# Patient Record
Sex: Male | Born: 1990 | Race: White | Hispanic: No | Marital: Single | Smoking: Current every day smoker
Health system: Southern US, Community
[De-identification: ages and names within clinical notes are randomized; demographics above are authoritative.]

## PROBLEM LIST (undated history)

## (undated) DIAGNOSIS — F32A Depression, unspecified: Secondary | ICD-10-CM

## (undated) DIAGNOSIS — F329 Major depressive disorder, single episode, unspecified: Secondary | ICD-10-CM

## (undated) DIAGNOSIS — F319 Bipolar disorder, unspecified: Secondary | ICD-10-CM

## (undated) DIAGNOSIS — F419 Anxiety disorder, unspecified: Secondary | ICD-10-CM

## (undated) HISTORY — PX: MANDIBLE FRACTURE SURGERY: SHX706

---

## 2012-01-30 ENCOUNTER — Encounter (HOSPITAL_COMMUNITY): Payer: Self-pay

## 2012-01-30 ENCOUNTER — Emergency Department (HOSPITAL_COMMUNITY)
Admission: EM | Admit: 2012-01-30 | Discharge: 2012-01-30 | Disposition: A | Discharge status: Home / Self Care | Payer: Self-pay | Attending: Emergency Medicine | Admitting: Emergency Medicine

## 2012-01-30 DIAGNOSIS — Y9389 Activity, other specified: Secondary | ICD-10-CM | POA: Insufficient documentation

## 2012-01-30 DIAGNOSIS — Y929 Unspecified place or not applicable: Secondary | ICD-10-CM | POA: Insufficient documentation

## 2012-01-30 DIAGNOSIS — Z8659 Personal history of other mental and behavioral disorders: Secondary | ICD-10-CM | POA: Insufficient documentation

## 2012-01-30 DIAGNOSIS — S61421A Laceration with foreign body of right hand, initial encounter: Secondary | ICD-10-CM

## 2012-01-30 DIAGNOSIS — F17200 Nicotine dependence, unspecified, uncomplicated: Secondary | ICD-10-CM | POA: Insufficient documentation

## 2012-01-30 DIAGNOSIS — W268XXA Contact with other sharp object(s), not elsewhere classified, initial encounter: Secondary | ICD-10-CM | POA: Insufficient documentation

## 2012-01-30 DIAGNOSIS — S61409A Unspecified open wound of unspecified hand, initial encounter: Secondary | ICD-10-CM | POA: Insufficient documentation

## 2012-01-30 DIAGNOSIS — M2540 Effusion, unspecified joint: Secondary | ICD-10-CM | POA: Insufficient documentation

## 2012-01-30 DIAGNOSIS — F319 Bipolar disorder, unspecified: Secondary | ICD-10-CM | POA: Insufficient documentation

## 2012-01-30 HISTORY — DX: Major depressive disorder, single episode, unspecified: F32.9

## 2012-01-30 HISTORY — DX: Anxiety disorder, unspecified: F41.9

## 2012-01-30 HISTORY — DX: Depression, unspecified: F32.A

## 2012-01-30 HISTORY — DX: Bipolar disorder, unspecified: F31.9

## 2012-01-30 MED ORDER — HYDROCODONE-ACETAMINOPHEN 5-325 MG PO TABS
1.0000 | ORAL_TABLET | Freq: Once | ORAL | Status: AC
  Administered 2012-01-30: 1 via ORAL
  Filled 2012-01-30: qty 1

## 2012-01-30 MED ORDER — HYDROCODONE-ACETAMINOPHEN 5-325 MG PO TABS: 1.0000 | ORAL_TABLET | ORAL | Status: DC | PRN

## 2012-01-30 MED ORDER — CEPHALEXIN 500 MG PO CAPS: 500.0000 mg | ORAL_CAPSULE | Freq: Four times a day (QID) | ORAL | Status: DC

## 2012-01-30 NOTE — ED Provider Notes (Signed)
Medical screening examination/treatment/procedure(s) were performed by non-physician practitioner and as supervising physician I was immediately available for consultation/collaboration.  Ethelda Chick, MD 01/30/12 2024

## 2012-01-30 NOTE — ED Notes (Signed)
Pt verbalizes understanding 
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
## 2012-01-30 NOTE — ED Provider Notes (Signed)
Medical screening examination/treatment/procedure(s) were performed by non-physician practitioner and as supervising physician I was immediately available for consultation/collaboration.  Ethelda Chick, MD 01/30/12 2023

## 2012-01-30 NOTE — ED Provider Notes (Signed)
LACERATION REPAIR Performed by: Langley Adie A Authorized by: Langley Adie A Consent: Verbal consent obtained. Risks and benefits: risks, benefits and alternatives were discussed Consent given by: patient Patient identity confirmed: provided demographic data Prepped and Draped in normal sterile fashion Wound explored  Laceration Location: right hand dorsum, overlying 2nd, 3rd MCP and interweb space  Laceration Length: 4 cm (total of 3 lacerations)  Wounds contains multiple foreign bodies consistent with ceramic soap dispenser as per history Anesthesia: local infiltration  Local anesthetic: lidocaine 1% w/o epinephrine  Anesthetic total: 2 ml  Irrigation method: syringe, copius irrigation performed as well as manual FB removal with forceps Amount of cleaning: extensive  Skin closure: Multi-layer - 6-0 Vicryl, 4-0 and 5-0 Prolene  Number of sutures: SQ - 3, 9 with mixed 4-0, 5-0 prolene with good wound edge approximation.  Technique: simple interrupted  Patient tolerance: Patient tolerated the procedure well with no immediate complications. Discussed possibility of retained foreign body and appropriate follow up.  Rodena Medin, PA-C 01/30/12 2011
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
## 2018-10-23 ENCOUNTER — Inpatient Hospital Stay (HOSPITAL_COMMUNITY)
Admission: AD | Admit: 2018-10-23 | Discharge: 2018-10-27 | DRG: 885 | Disposition: A | Discharge status: Home / Self Care | Payer: Federal, State, Local not specified - Other | Source: Intra-hospital | Attending: Psychiatry | Admitting: Psychiatry

## 2018-10-23 ENCOUNTER — Emergency Department (HOSPITAL_COMMUNITY)
Admission: EM | Admit: 2018-10-23 | Discharge: 2018-10-23 | Disposition: A | Discharge status: Other Acute Inpatient Hospital | Payer: Self-pay | Attending: Emergency Medicine | Admitting: Emergency Medicine

## 2018-10-23 ENCOUNTER — Encounter (HOSPITAL_COMMUNITY): Payer: Self-pay | Admitting: *Deleted

## 2018-10-23 ENCOUNTER — Other Ambulatory Visit: Payer: Self-pay

## 2018-10-23 ENCOUNTER — Encounter (HOSPITAL_COMMUNITY): Payer: Self-pay | Admitting: Emergency Medicine

## 2018-10-23 ENCOUNTER — Ambulatory Visit (HOSPITAL_COMMUNITY)
Admission: EM | Admit: 2018-10-23 | Discharge: 2018-10-23 | Disposition: A | Discharge status: Home / Self Care | Payer: Self-pay | Attending: Psychiatry | Admitting: Psychiatry

## 2018-10-23 DIAGNOSIS — L0390 Cellulitis, unspecified: Secondary | ICD-10-CM | POA: Insufficient documentation

## 2018-10-23 DIAGNOSIS — F12280 Cannabis dependence with cannabis-induced anxiety disorder: Secondary | ICD-10-CM | POA: Diagnosis not present

## 2018-10-23 DIAGNOSIS — F332 Major depressive disorder, recurrent severe without psychotic features: Secondary | ICD-10-CM | POA: Insufficient documentation

## 2018-10-23 DIAGNOSIS — F329 Major depressive disorder, single episode, unspecified: Secondary | ICD-10-CM | POA: Insufficient documentation

## 2018-10-23 DIAGNOSIS — R454 Irritability and anger: Secondary | ICD-10-CM | POA: Insufficient documentation

## 2018-10-23 DIAGNOSIS — F13239 Sedative, hypnotic or anxiolytic dependence with withdrawal, unspecified: Secondary | ICD-10-CM | POA: Diagnosis present

## 2018-10-23 DIAGNOSIS — F1920 Other psychoactive substance dependence, uncomplicated: Secondary | ICD-10-CM | POA: Diagnosis not present

## 2018-10-23 DIAGNOSIS — F319 Bipolar disorder, unspecified: Secondary | ICD-10-CM | POA: Diagnosis present

## 2018-10-23 DIAGNOSIS — F1320 Sedative, hypnotic or anxiolytic dependence, uncomplicated: Secondary | ICD-10-CM | POA: Diagnosis not present

## 2018-10-23 DIAGNOSIS — Z20828 Contact with and (suspected) exposure to other viral communicable diseases: Secondary | ICD-10-CM | POA: Insufficient documentation

## 2018-10-23 DIAGNOSIS — Z915 Personal history of self-harm: Secondary | ICD-10-CM

## 2018-10-23 DIAGNOSIS — F32A Depression, unspecified: Secondary | ICD-10-CM

## 2018-10-23 DIAGNOSIS — F17210 Nicotine dependence, cigarettes, uncomplicated: Secondary | ICD-10-CM | POA: Insufficient documentation

## 2018-10-23 DIAGNOSIS — Z7289 Other problems related to lifestyle: Secondary | ICD-10-CM

## 2018-10-23 DIAGNOSIS — M21331 Wrist drop, right wrist: Secondary | ICD-10-CM | POA: Insufficient documentation

## 2018-10-23 DIAGNOSIS — F1910 Other psychoactive substance abuse, uncomplicated: Secondary | ICD-10-CM | POA: Insufficient documentation

## 2018-10-23 DIAGNOSIS — R45850 Homicidal ideations: Secondary | ICD-10-CM | POA: Diagnosis present

## 2018-10-23 DIAGNOSIS — R45851 Suicidal ideations: Secondary | ICD-10-CM | POA: Insufficient documentation

## 2018-10-23 DIAGNOSIS — T07XXXA Unspecified multiple injuries, initial encounter: Secondary | ICD-10-CM

## 2018-10-23 DIAGNOSIS — F411 Generalized anxiety disorder: Secondary | ICD-10-CM | POA: Insufficient documentation

## 2018-10-23 DIAGNOSIS — F1210 Cannabis abuse, uncomplicated: Secondary | ICD-10-CM | POA: Insufficient documentation

## 2018-10-23 DIAGNOSIS — R079 Chest pain, unspecified: Secondary | ICD-10-CM

## 2018-10-23 LAB — CBC WITH DIFFERENTIAL/PLATELET
Abs Immature Granulocytes: 0.02 10*3/uL (ref 0.00–0.07)
Basophils Absolute: 0 10*3/uL (ref 0.0–0.1)
Basophils Relative: 0 %
Eosinophils Absolute: 0.1 10*3/uL (ref 0.0–0.5)
Eosinophils Relative: 1 %
HCT: 40.9 % (ref 39.0–52.0)
Hemoglobin: 13.3 g/dL (ref 13.0–17.0)
Immature Granulocytes: 0 %
Lymphocytes Relative: 30 %
Lymphs Abs: 2.3 10*3/uL (ref 0.7–4.0)
MCH: 29.9 pg (ref 26.0–34.0)
MCHC: 32.5 g/dL (ref 30.0–36.0)
MCV: 91.9 fL (ref 80.0–100.0)
Monocytes Absolute: 0.4 10*3/uL (ref 0.1–1.0)
Monocytes Relative: 6 %
Neutro Abs: 4.8 10*3/uL (ref 1.7–7.7)
Neutrophils Relative %: 63 %
Platelets: 218 10*3/uL (ref 150–400)
RBC: 4.45 MIL/uL (ref 4.22–5.81)
RDW: 12.3 % (ref 11.5–15.5)
WBC: 7.7 10*3/uL (ref 4.0–10.5)
nRBC: 0 % (ref 0.0–0.2)

## 2018-10-23 LAB — RAPID URINE DRUG SCREEN, HOSP PERFORMED
Amphetamines: NOT DETECTED
Barbiturates: NOT DETECTED
Benzodiazepines: POSITIVE — AB
Cocaine: NOT DETECTED
Opiates: POSITIVE — AB
Tetrahydrocannabinol: POSITIVE — AB

## 2018-10-23 LAB — COMPREHENSIVE METABOLIC PANEL
ALT: 13 U/L (ref 0–44)
AST: 19 U/L (ref 15–41)
Albumin: 4.9 g/dL (ref 3.5–5.0)
Alkaline Phosphatase: 57 U/L (ref 38–126)
Anion gap: 11 (ref 5–15)
BUN: 9 mg/dL (ref 6–20)
CO2: 29 mmol/L (ref 22–32)
Calcium: 9.8 mg/dL (ref 8.9–10.3)
Chloride: 100 mmol/L (ref 98–111)
Creatinine, Ser: 0.87 mg/dL (ref 0.61–1.24)
GFR calc Af Amer: >60 mL/min (ref >60)
GFR calc non Af Amer: >60 mL/min (ref >60)
Glucose, Bld: 89 mg/dL (ref 70–99)
Potassium: 3.6 mmol/L (ref 3.5–5.1)
Sodium: 140 mmol/L (ref 135–145)
Total Bilirubin: 2.1 mg/dL — ABNORMAL HIGH (ref 0.3–1.2)
Total Protein: 8 g/dL (ref 6.5–8.1)

## 2018-10-23 LAB — SARS CORONAVIRUS 2 BY RT PCR (HOSPITAL ORDER, PERFORMED IN ~~LOC~~ HOSPITAL LAB): SARS Coronavirus 2: NEGATIVE

## 2018-10-23 LAB — ETHANOL: Alcohol, Ethyl (B): <10 mg/dL (ref <10)

## 2018-10-23 MED ORDER — CEPHALEXIN 500 MG PO CAPS
500.0000 mg | ORAL_CAPSULE | Freq: Once | ORAL | Status: AC
  Administered 2018-10-23: 22:00:00 500 mg via ORAL
  Filled 2018-10-23: qty 1

## 2018-10-23 MED ORDER — BACITRACIN ZINC 500 UNIT/GM EX OINT
TOPICAL_OINTMENT | CUTANEOUS | Status: AC
  Filled 2018-10-23: qty 28.35

## 2018-10-23 MED ORDER — NICOTINE 21 MG/24HR TD PT24
21.0000 mg | MEDICATED_PATCH | Freq: Once | TRANSDERMAL | Status: DC
  Administered 2018-10-23: 19:00:00 21 mg via TRANSDERMAL
  Filled 2018-10-23: qty 1

## 2018-10-23 MED ORDER — MAGNESIUM HYDROXIDE 400 MG/5ML PO SUSP: 30.0000 mL | Freq: Every day | ORAL | Status: DC | PRN

## 2018-10-23 MED ORDER — BACITRACIN ZINC 500 UNIT/GM EX OINT
TOPICAL_OINTMENT | CUTANEOUS | Status: AC
  Administered 2018-10-23: 1 via TOPICAL
  Filled 2018-10-23: qty 0.9

## 2018-10-23 MED ORDER — ACETAMINOPHEN 325 MG PO TABS
650.0000 mg | ORAL_TABLET | Freq: Four times a day (QID) | ORAL | Status: DC | PRN
  Administered 2018-10-25 – 2018-10-26 (×4): 650 mg via ORAL
  Filled 2018-10-23 (×4): qty 2

## 2018-10-23 MED ORDER — NICOTINE 21 MG/24HR TD PT24
21.0000 mg | MEDICATED_PATCH | Freq: Once | TRANSDERMAL | Status: DC
  Filled 2018-10-23: qty 1

## 2018-10-23 MED ORDER — CEPHALEXIN 500 MG PO CAPS
500.0000 mg | ORAL_CAPSULE | Freq: Once | ORAL | Status: AC
  Administered 2018-10-23: 500 mg via ORAL
  Filled 2018-10-23: qty 1

## 2018-10-23 MED ORDER — ALUM & MAG HYDROXIDE-SIMETH 200-200-20 MG/5ML PO SUSP: 30.0000 mL | ORAL | Status: DC | PRN

## 2018-10-23 MED ORDER — TRAZODONE HCL 100 MG PO TABS
100.0000 mg | ORAL_TABLET | Freq: Every evening | ORAL | Status: DC | PRN
  Administered 2018-10-23: 100 mg via ORAL
  Filled 2018-10-23: qty 1

## 2018-10-23 MED ORDER — HYDROXYZINE HCL 25 MG PO TABS
25.0000 mg | ORAL_TABLET | Freq: Three times a day (TID) | ORAL | Status: DC | PRN
  Administered 2018-10-23 – 2018-10-24 (×2): 25 mg via ORAL
  Filled 2018-10-23 (×2): qty 1

## 2018-10-23 NOTE — Tx Team (Signed)
Initial Treatment Plan 10/23/2018 10:08 PM Edward White ERQ:[id number]    PATIENT STRESSORS: Financial difficulties Marital or family conflict Occupational concerns Substance abuse   PATIENT STRENGTHS: Average or above average intelligence Capable of independent living Communication skills General fund of knowledge Motivation for treatment/growth Physical Health Supportive family/friends   PATIENT IDENTIFIED PROBLEMS: Work on Radiographer, therapeutic for anxiety      Work on Radiographer, therapeutic for anger               DISCHARGE CRITERIA:  Ability to meet basic life and health needs Adequate post-discharge living arrangements Improved stabilization in mood, thinking, and/or behavior Medical problems require only outpatient monitoring Motivation to continue treatment in a less acute level of care Need for constant or close observation no longer present Reduction of life-threatening or endangering symptoms to within safe limits Safe-care adequate arrangements made Verbal commitment to aftercare and medication compliance  PRELIMINARY DISCHARGE PLAN: Attend aftercare/continuing care group Attend PHP/IOP Outpatient therapy Placement in alternative living arrangements Return to previous living arrangement  PATIENT/FAMILY INVOLVEMENT: This treatment plan has been presented to and reviewed with the patient, Edward White.  The patient has been given the opportunity to ask questions and make suggestions.  Lucile Crater, RN 10/23/2018, 10:08 PM

## 2018-10-23 NOTE — ED Provider Notes (Signed)
Assumed care of patient at change of shift.  Patient is medically cleared for behavioral health evaluation.   Tacy Learn, PA-C 10/23/18 1611    Valarie Merino, MD 10/26/18 [phone]
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
## 2018-10-23 NOTE — Progress Notes (Signed)
Psychoeducational Group Note  Date:  10/23/2018 Time:  2130  Group Topic/Focus:  Wrap-Up Group:   The focus of this group is to help patients review their daily goal of treatment and discuss progress on daily workbooks.  Participation Level: Did Not Attend  Participation Quality:  Not Applicable  Affect:  Not Applicable  Cognitive:  Not Applicable  Insight:  Not Applicable  Engagement in Group: Not Applicable  Additional Comments:  The patient did not attend group this evening.   [name] 10/23/2018, 9:30 PM

## 2018-10-23 NOTE — Care Management (Signed)
[facility] is reviewing the patient  

## 2018-10-23 NOTE — BH Assessment (Signed)
Under Review for placement  [place]

## 2018-10-23 NOTE — BH Assessment (Signed)
Accepted to [facility] Bed 303-2 Accepting is Dr. Parke Poisson The number to give report is [phone] Writer informed the RN working with the patient

## 2018-10-23 NOTE — ED Notes (Signed)
Gave report to Danae Chen, RN for [facility] room 303-2.

## 2018-10-23 NOTE — ED Provider Notes (Signed)
[facility]-EMERGENCY DEPT Provider Note   CSN: [id number] Arrival date & time: 10/23/18  1301     History   Chief Complaint Chief Complaint  Patient presents with  . wrist pains  . Medical Clearance  . IVC    HPI Edward White is a 28 y.o. male.     Patient is a 28 y/o male with past medical history of anxiety, bipolar disorder, depression who presents to the emergency department for medical clearance prior to admission to behavioral health [facility].  Patient reports ongoing issues with depression and anxiety.  Patient reports open wounds to the right upper extremity due to anxiety.  Reports not intentionally trying to hurt himself but that he gets anxious that he picks at his skin and causes bleeding and irritation.  Denies any fever or chills.  Also reports that he took some Xanax last night which knocked him out and he fell asleep with his head on his arm.  When he woke up he was unable to extend his right wrist.  Reports some pain into the elbow and decreased sensation in the hand.  Denies any homicidal ideation.     Past Medical History:  Diagnosis Date  . Anxiety   . Bipolar 1 disorder (HCC)   . Depression     There are no active problems to display for this patient.   History reviewed. No pertinent surgical history.      Home Medications    Prior to Admission medications   Medication Sig Start Date End Date Taking? Authorizing Provider  cephALEXin (KEFLEX) 500 MG capsule Take 1 capsule (500 mg total) by mouth 4 (four) times daily. 01/30/12   Elpidio AnisUpstill, Shari, PA-C  HYDROcodone-acetaminophen (NORCO/VICODIN) 5-325 MG per tablet Take 1 tablet by mouth every 4 (four) hours as needed for pain. 01/30/12   Elpidio AnisUpstill, Shari, PA-C    Family History No family history on file.  Social History Social History   Tobacco Use  . Smoking status: Current Every Day Smoker    Packs/day: 0.50    Years: 1.00    Pack years: 0.50    Types: Cigarettes  .  Smokeless tobacco: Never Used  Substance Use Topics  . Alcohol use: No  . Drug use: Yes    Frequency: 5.0 times per week    Types: Marijuana     Allergies   Patient has no known allergies.   Review of Systems Review of Systems  Constitutional: Negative for chills and fever.  Respiratory: Negative for cough and shortness of breath.   Cardiovascular: Negative for chest pain.  Gastrointestinal: Negative for nausea and vomiting.  Genitourinary: Negative for dysuria.  Musculoskeletal: Positive for arthralgias. Negative for gait problem, myalgias and neck pain.  Skin: Positive for wound.  Neurological: Positive for weakness and numbness. Negative for dizziness, facial asymmetry and light-headedness.  Psychiatric/Behavioral: Positive for self-injury. Negative for agitation, hallucinations, sleep disturbance and suicidal ideas. The patient is nervous/anxious.      Physical Exam Updated Vital Signs BP (!) 145/79   Pulse 79   Temp 98.7 F (37.1 C) (Oral)   Resp 18   SpO2 98%   Physical Exam Vitals signs and nursing note reviewed.  Constitutional:      Appearance: Normal appearance.  HENT:     Head: Normocephalic.  Eyes:     Conjunctiva/sclera: Conjunctivae normal.  Pulmonary:     Effort: Pulmonary effort is normal.  Musculoskeletal:     Right wrist: He exhibits decreased range of motion  and tenderness. He exhibits no bony tenderness, no swelling, no effusion, no crepitus, no deformity and no laceration.     Comments: Patient has decreased sensation over the dorsum of the hand.  Inability to extend the wrist.  Normal grip strength and normal flexion.  Elbow, biceps, triceps, shoulder are all normal.    Skin:    General: Skin is dry.       Neurological:     Mental Status: He is alert.  Psychiatric:        Mood and Affect: Mood normal.      ED Treatments / Results  Labs (all labs ordered are listed, but only abnormal results are displayed) Labs Reviewed   COMPREHENSIVE METABOLIC PANEL - Abnormal; Notable for the following components:      Result Value   Total Bilirubin 2.1 (*)    All other components within normal limits  SARS CORONAVIRUS 2 (HOSPITAL ORDER, [facility])  ETHANOL  CBC WITH DIFFERENTIAL/PLATELET  RAPID URINE DRUG SCREEN, HOSP PERFORMED    EKG EKG Interpretation  Date/Time:  Tuesday October 23 2018 14:24:00 EDT Ventricular Rate:  64 PR Interval:    QRS Duration: 92 QT Interval:  370 QTC Calculation: 382 R Axis:   61 Text Interpretation:  Sinus rhythm Confirmed by Dene Gentry [phone]) on 10/23/2018 3:01:31 PM   Radiology No results found.  Procedures Procedures (including critical care time)  Medications Ordered in ED Medications  cephALEXin (KEFLEX) capsule 500 mg (500 mg Oral Given 10/23/18 1426)  bacitracin ointment (1 application Topical Given 10/23/18 1429)     Initial Impression / Assessment and Plan / ED Course  I have reviewed the triage vital signs and the nursing notes.  Pertinent labs & imaging results that were available during my care of the patient were reviewed by me and considered in my medical decision making (see chart for details).  Clinical Course as of Oct 22 1501  Tue Oct 23, 2018  1503 Pending psych clearance, then he may go to [facility]. Patient care handed to Suella Broad PA-C due to end of shift   [KM]    Clinical Course User Index [KM] Alveria Apley, PA-C        Final Clinical Impressions(s) / ED Diagnoses   Final diagnoses:  Depression, unspecified depression type  Wrist drop, acquired, right  Multiple excoriations  Cellulitis, unspecified cellulitis site    ED Discharge Orders    None       Kristine Royal 10/23/18 1504    Little, Wenda Overland, MD 10/24/18 [phone]

## 2018-10-24 DIAGNOSIS — F12280 Cannabis dependence with cannabis-induced anxiety disorder: Secondary | ICD-10-CM

## 2018-10-24 DIAGNOSIS — F1320 Sedative, hypnotic or anxiolytic dependence, uncomplicated: Secondary | ICD-10-CM

## 2018-10-24 LAB — LIPID PANEL
Cholesterol: 114 mg/dL (ref 0–200)
HDL: 55 mg/dL (ref >40)
LDL Cholesterol: 45 mg/dL (ref 0–99)
Total CHOL/HDL Ratio: 2.1 RATIO
Triglycerides: 69 mg/dL (ref <150)
VLDL: 14 mg/dL (ref 0–40)

## 2018-10-24 LAB — HEMOGLOBIN A1C
Hgb A1c MFr Bld: 5.4 % (ref 4.8–5.6)
Mean Plasma Glucose: 108.28 mg/dL

## 2018-10-24 LAB — TSH: TSH: 0.537 u[IU]/mL (ref 0.350–4.500)

## 2018-10-24 MED ORDER — LORAZEPAM 1 MG PO TABS: 1.0000 mg | ORAL_TABLET | Freq: Two times a day (BID) | ORAL | Status: DC

## 2018-10-24 MED ORDER — LORAZEPAM 1 MG PO TABS
1.0000 mg | ORAL_TABLET | Freq: Three times a day (TID) | ORAL | Status: DC
  Administered 2018-10-25 – 2018-10-26 (×2): 1 mg via ORAL
  Filled 2018-10-24 (×2): qty 1

## 2018-10-24 MED ORDER — LORAZEPAM 1 MG PO TABS
1.0000 mg | ORAL_TABLET | Freq: Four times a day (QID) | ORAL | Status: DC | PRN
  Filled 2018-10-24: qty 1

## 2018-10-24 MED ORDER — VITAMIN B-1 100 MG PO TABS
100.0000 mg | ORAL_TABLET | Freq: Every day | ORAL | Status: DC
  Administered 2018-10-25 – 2018-10-27 (×3): 100 mg via ORAL
  Filled 2018-10-24 (×6): qty 1

## 2018-10-24 MED ORDER — SILVER SULFADIAZINE 1 % EX CREA
TOPICAL_CREAM | Freq: Two times a day (BID) | CUTANEOUS | Status: AC
  Administered 2018-10-24: 11:00:00 via TOPICAL
  Administered 2018-10-25: 1 via TOPICAL
  Filled 2018-10-24 (×2): qty 85
  Filled 2018-10-24: qty 50

## 2018-10-24 MED ORDER — LOPERAMIDE HCL 2 MG PO CAPS: 2.0000 mg | ORAL_CAPSULE | ORAL | Status: DC | PRN

## 2018-10-24 MED ORDER — ONDANSETRON 4 MG PO TBDP: 4.0000 mg | ORAL_TABLET | Freq: Four times a day (QID) | ORAL | Status: DC | PRN

## 2018-10-24 MED ORDER — NICOTINE POLACRILEX 2 MG MT GUM
2.0000 mg | CHEWING_GUM | OROMUCOSAL | Status: DC | PRN
  Administered 2018-10-24: 10:00:00 2 mg via ORAL
  Filled 2018-10-24: qty 1

## 2018-10-24 MED ORDER — ADULT MULTIVITAMIN W/MINERALS CH
1.0000 | ORAL_TABLET | Freq: Every day | ORAL | Status: DC
  Administered 2018-10-24 – 2018-10-27 (×4): 1 via ORAL
  Filled 2018-10-24 (×8): qty 1

## 2018-10-24 MED ORDER — GABAPENTIN 100 MG PO CAPS
100.0000 mg | ORAL_CAPSULE | Freq: Three times a day (TID) | ORAL | Status: DC
  Administered 2018-10-24 – 2018-10-25 (×2): 100 mg via ORAL
  Filled 2018-10-24 (×7): qty 1

## 2018-10-24 MED ORDER — LORAZEPAM 1 MG PO TABS
1.0000 mg | ORAL_TABLET | Freq: Four times a day (QID) | ORAL | Status: AC
  Administered 2018-10-24 – 2018-10-25 (×4): 1 mg via ORAL
  Filled 2018-10-24 (×4): qty 1

## 2018-10-24 MED ORDER — HYDROXYZINE HCL 25 MG PO TABS
25.0000 mg | ORAL_TABLET | Freq: Four times a day (QID) | ORAL | Status: DC | PRN
  Administered 2018-10-24 – 2018-10-26 (×4): 25 mg via ORAL
  Filled 2018-10-24 (×5): qty 1

## 2018-10-24 MED ORDER — TRAZODONE HCL 100 MG PO TABS
100.0000 mg | ORAL_TABLET | Freq: Every day | ORAL | Status: AC
  Administered 2018-10-24: 22:00:00 100 mg via ORAL
  Filled 2018-10-24 (×2): qty 1

## 2018-10-24 MED ORDER — NICOTINE 21 MG/24HR TD PT24
21.0000 mg | MEDICATED_PATCH | Freq: Every day | TRANSDERMAL | Status: DC
  Administered 2018-10-24 – 2018-10-27 (×3): 21 mg via TRANSDERMAL
  Filled 2018-10-24 (×5): qty 1

## 2018-10-24 MED ORDER — SERTRALINE HCL 50 MG PO TABS
50.0000 mg | ORAL_TABLET | Freq: Every day | ORAL | Status: DC
  Administered 2018-10-24 – 2018-10-27 (×4): 50 mg via ORAL
  Filled 2018-10-24: qty 7
  Filled 2018-10-24 (×5): qty 1
  Filled 2018-10-24: qty 7
  Filled 2018-10-24 (×2): qty 1

## 2018-10-24 MED ORDER — LORAZEPAM 1 MG PO TABS: 1.0000 mg | ORAL_TABLET | Freq: Every day | ORAL | Status: DC
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
## 2018-10-24 NOTE — BHH Group Notes (Signed)
Occupational Therapy Group Note  Date:  10/24/2018 Time:  6:12 PM  Group Topic/Focus:  Stress Management  Participation Level:  Active  Participation Quality:  Appropriate  Affect:  Depressed  Cognitive:  Alert  Insight: Improving  Engagement in Group:  Engaged  Modes of Intervention:  Activity, Discussion, Education and Socialization  Additional Comments:    S: Deep breathing and counting has helped me  O: Education given on stress management skills. Pt asked to share current unhealthy coping mechanisms, and how they react to stress. After education, pt asked to share new preferred coping skill as of this date.  A: Pt shares that his most harmful coping mechanism is substance use. He shares how he over used, and passed out on hand causing a nerve injury which has increased his stress. He shares how deep breathing and counting have been helpful. He mentions how he engages in self harm, and would like to use ice in a healthy way to manage this. Discussed splint order with pt and RN/MD for radial nerve palsy splint. Order has been placed.  P: OT group will be x1 per week while pt inpatient.  Zenovia Jarred, MSOT, OTR/L Behavioral Health OT/ Acute Relief OT PHP Office: [place] 10/24/2018, 6:12 PM

## 2018-10-24 NOTE — Progress Notes (Signed)
D:  Patient denied  SI and HI, contracts for safety.  Denied A/V hallucinations. A:  Medications administered per MD orders.  Encouragement and emoitonal support given patient. R:  Safety maintained with 15 minute checks.
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
## 2018-10-24 NOTE — Progress Notes (Signed)
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  Pt tolerated splinting well.  He was instructed in wear schedule and able to don/doff splint independently.  He also was able to verbalize understanding of precautions and need to monitor skin for signs of pressure - redness, increased edema, increased pain.  Full note to follow.    10/24/18 2200  OT Time Calculation  OT Start Time (ACUTE ONLY) 1148  OT Stop Time (ACUTE ONLY) 1321  OT Time Calculation (min) 93 min  OT General Charges  $OT Visit 1 Visit  OT Evaluation  $OT Eval Moderate Complexity 1 Mod  OT Treatments  $Orthotics Fit/Training 68-82 mins  $ Splint materials basic 1 Supply  Lucille Passy, OTR/L Acute Rehabilitation Services Pager [phone] Office [phone]

## 2018-10-24 NOTE — [facility] Group Notes (Signed)
[facility] Group Notes:  (Nursing/MHT/Case Management/Adjunct)  Date:  10/24/2018  Time:  1:30 PM  Type of Therapy:  Nurse Education  Participation Level:  Active  Participation Quality:  Appropriate, Attentive and Sharing  Affect:  Appropriate  Cognitive:  Alert and Appropriate  Insight:  Appropriate, Good and Improving  Engagement in Group:  Developing/Improving, Engaged, Improving and Supportive  Modes of Intervention:  Discussion, Education, Exploration and Support  Summary of Progress/Problems: Pt's were asked to discuss personal development through short and long term goal setting. Pt's were asked to share their goal for the day, how they were achieving this, impediments to their goal, and resources they could leverage to overcome those.  Pt shared that his goal for today was to take responsibility for his actions, especially being here. Pt states blaming others is easy and he turns this often. Pt states he wants to become a Personal assistant for a long term goal to help people struggling with addiction like him.   Edward White 10/24/2018, 5:36 PM

## 2018-10-24 NOTE — [facility] Suicide Risk Assessment (Signed)
[facility] INPATIENT:  Family/Significant Other Suicide Prevention Education  Suicide Prevention Education:  Patient Refusal for Family/Significant Other Suicide Prevention Education: The patient Edward White has refused to provide written consent for family/significant other to be provided Family/Significant Other Suicide Prevention Education during admission and/or prior to discharge.  Physician notified.  Joellen Jersey 10/24/2018, 4:10 PM

## 2018-10-24 NOTE — Tx Team (Signed)
Interdisciplinary Treatment and Diagnostic Plan Update  10/24/2018 Time of Session: 9:00am Edward White MRN: [id number]  Principal Diagnosis: <principal problem not specified>  Secondary Diagnoses: Active Problems:   MDD (major depressive disorder), recurrent episode, severe (HCC)   Current Medications:  Current Facility-Administered Medications  Medication Dose Route Frequency Provider Last Rate Last Dose  . acetaminophen (TYLENOL) tablet 650 mg  650 mg Oral Q6H PRN Suella Broad, FNP      . alum & mag hydroxide-simeth (MAALOX/MYLANTA) 200-200-20 MG/5ML suspension 30 mL  30 mL Oral Q4H PRN Starkes-Perry, Gayland Curry, FNP      . bacitracin ointment   Topical To ER Burt Ek, Gayland Curry, FNP      . hydrOXYzine (ATARAX/VISTARIL) tablet 25 mg  25 mg Oral TID PRN Suella Broad, FNP   25 mg at 10/23/18 2137  . magnesium hydroxide (MILK OF MAGNESIA) suspension 30 mL  30 mL Oral Daily PRN Suella Broad, FNP      . nicotine polacrilex (NICORETTE) gum 2 mg  2 mg Oral PRN Cobos, Myer Peer, MD      . silver sulfADIAZINE (SILVADENE) 1 % cream   Topical BID Cobos, Myer Peer, MD      . traZODone (DESYREL) tablet 100 mg  100 mg Oral QHS PRN Suella Broad, FNP   100 mg at 10/23/18 2203   PTA Medications: No medications prior to admission.    Patient Stressors: Financial difficulties Marital or family conflict Occupational concerns Substance abuse  Patient Strengths: Average or above average intelligence Capable of independent living Curator fund of knowledge Motivation for treatment/growth Physical Health Supportive family/friends  Treatment Modalities: Medication Management, Group therapy, Case management,  1 to 1 session with clinician, Psychoeducation, Recreational therapy.   Physician Treatment Plan for Primary Diagnosis: <principal problem not specified> Long Term Goal(s):     Short Term Goals:    Medication Management:  Evaluate patient's response, side effects, and tolerance of medication regimen.  Therapeutic Interventions: 1 to 1 sessions, Unit Group sessions and Medication administration.  Evaluation of Outcomes: Not Met  Physician Treatment Plan for Secondary Diagnosis: Active Problems:   MDD (major depressive disorder), recurrent episode, severe ([place])  Long Term Goal(s):     Short Term Goals:       Medication Management: Evaluate patient's response, side effects, and tolerance of medication regimen.  Therapeutic Interventions: 1 to 1 sessions, Unit Group sessions and Medication administration.  Evaluation of Outcomes: Not Met   RN Treatment Plan for Primary Diagnosis: <principal problem not specified> Long Term Goal(s): Knowledge of disease and therapeutic regimen to maintain health will improve  Short Term Goals: Ability to demonstrate self-control, Ability to participate in decision making will improve, Ability to identify and develop effective coping behaviors will improve and Compliance with prescribed medications will improve  Medication Management: RN will administer medications as ordered by provider, will assess and evaluate patient's response and provide education to patient for prescribed medication. RN will report any adverse and/or side effects to prescribing provider.  Therapeutic Interventions: 1 on 1 counseling sessions, Psychoeducation, Medication administration, Evaluate responses to treatment, Monitor vital signs and CBGs as ordered, Perform/monitor CIWA, COWS, AIMS and Fall Risk screenings as ordered, Perform wound care treatments as ordered.  Evaluation of Outcomes: Not Met   LCSW Treatment Plan for Primary Diagnosis: <principal problem not specified> Long Term Goal(s): Safe transition to appropriate next level of care at discharge, Engage patient in therapeutic group addressing interpersonal concerns.  Short Term  Goals: Engage patient in aftercare planning with referrals  and resources, Increase social support, Identify triggers associated with mental health/substance abuse issues and Increase skills for wellness and recovery  Therapeutic Interventions: Assess for all discharge needs, 1 to 1 time with Social worker, Explore available resources and support systems, Assess for adequacy in community support network, Educate family and significant other(s) on suicide prevention, Complete Psychosocial Assessment, Interpersonal group therapy.  Evaluation of Outcomes: Not Met   Progress in Treatment: Attending groups: Yes. Participating in groups: Yes. Taking medication as prescribed: Yes. Toleration medication: Yes. Family/Significant other contact made: No, will contact:  supports if consents are granted. Patient understands diagnosis: Yes. Discussing patient identified problems/goals with staff: Yes. Medical problems stabilized or resolved: Yes. Denies suicidal/homicidal ideation: Yes. Issues/concerns per patient self-inventory: Yes.  New problem(s) identified: Yes, Describe:  legal issues, no insurance  New Short Term/Long Term Goal(s): detox, medication management for mood stabilization; elimination of SI thoughts; development of comprehensive mental wellness/sobriety plan.  Patient Goals:  "Don't be as crazy, no suicidal thoughts."  Discharge Plan or Barriers: CSW assessing for appropriate referrals.   Reason for Continuation of Hospitalization: Anxiety Medication stabilization Suicidal ideation  Estimated Length of Stay: 3-5 days  Attendees: Patient: Edward White 10/24/2018 9:40 AM  Physician: Larene Beach 10/24/2018 9:40 AM  Nursing: Legrand Como, RN 10/24/2018 9:40 AM  RN Care Manager: 10/24/2018 9:40 AM  Social Worker: Stephanie Acre, [place] 10/24/2018 9:40 AM  Recreational Therapist:  10/24/2018 9:40 AM  Other: Harriett Sine, NP 10/24/2018 9:40 AM  Other:  10/24/2018 9:40 AM  Other: 10/24/2018 9:40 AM    Scribe for Treatment Team: Joellen Jersey,  LCSWA 10/24/2018 9:40 AM

## 2018-10-24 NOTE — Plan of Care (Signed)
Nurse discussed anxiety, depression and coping skills with patient.  
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
## 2018-10-25 MED ORDER — TRAZODONE HCL 100 MG PO TABS
100.0000 mg | ORAL_TABLET | Freq: Every day | ORAL | Status: DC
  Administered 2018-10-25: 100 mg via ORAL
  Filled 2018-10-25 (×3): qty 1

## 2018-10-25 MED ORDER — GABAPENTIN 100 MG PO CAPS
200.0000 mg | ORAL_CAPSULE | Freq: Three times a day (TID) | ORAL | Status: DC
  Administered 2018-10-25 – 2018-10-27 (×6): 200 mg via ORAL
  Filled 2018-10-25 (×2): qty 2
  Filled 2018-10-25 (×5): qty 42
  Filled 2018-10-25 (×2): qty 2
  Filled 2018-10-25: qty 42
  Filled 2018-10-25 (×6): qty 2

## 2018-10-25 NOTE — Progress Notes (Signed)
Pt complained of not being able to fall a sleep and feeling jittery, Ativan 1 Mg PO, Vistaril 25 mg Po, and  Trazodone 100 mg PO given , but pt still complained of not being able to sleep. Pt was given Tylenol 650 mg PO for right hand pain, pt went to bed and did not come out again, will continue to monitor.

## 2018-10-25 NOTE — BHH Group Notes (Signed)
LCSW Aftercare Discharge Planning Group Note  10/25/2018 8:45am  Type of Group and Topic: Psychoeducational Group: Discharge Planning  Participation Level: Active  Description of Group  Discharge planning group reviews patient's anticipated discharge plans and assists patients to anticipate and address any barriers to wellness/recovery in the community. Suicide prevention education is reviewed with patients in group.  Therapeutic Goals  1. Patients will state their anticipated discharge plan and mental health aftercare  2. Patients will identify potential barriers to wellness in the community setting  3. Patients will engage in problem solving, solution focused discussion of ways to anticipate and address barriers to wellness/recovery  Summary of Patient Progress  Plan for Discharge/Comments: Plans to follow up with outpatient providers.  Transportation Means: Family  Supports: Very limited, friends are users.  Therapeutic Modalities:  Motivational Interviewing  Edward White, [place]  10/25/2018 3:19 PM

## 2018-10-25 NOTE — BHH Group Notes (Signed)
[facility] Group Notes:  (Nursing/MHT/Case Management/Adjunct)  Date:  10/25/2018  Time:  10:00 AM  Type of Therapy:  Nurse Education  Participation Level:  Active  Participation Quality:  Appropriate, Attentive, Sharing and Supportive  Affect:  Appropriate  Cognitive:  Alert and Appropriate  Insight:  Appropriate  Engagement in Group:  Developing/Improving, Engaged, Improving and Supportive  Modes of Intervention:  Discussion, Education, Exploration, Socialization and Support  Summary of Progress/Problems: pt's were asked to discuss crisis present and past. Pt's were guided through triggers or events they feel lead to the need for crisis management. Pt's stated their goal for today regarding crisis management, how this goal would help decrease future crisis, and how they were going to achieve this. Lastly, Pt's discussed alternative interventions they could use instead of negative coping strategies currently being utilized.  Pt shared multiple times and shared ways he has been coping negatively. Pt shared ways he could change this, along with resources in the community that others could utilize.   Otelia Limes Demarkus Remmel 10/25/2018, 12:28 PM

## 2018-10-25 NOTE — Progress Notes (Signed)
Pt did attend the evening wrap up group. 

## 2018-10-25 NOTE — Progress Notes (Signed)
Patient ID: Edward White, male   DOB: 06/11/1990, 28 y.o.   MRN: [id number] Offered pt prn ativan but he stated "it does not help".

## 2018-10-25 NOTE — BHH Group Notes (Signed)
Adult Psychoeducational Group Note  Date:  10/25/2018 Time:  9:27 PM  Group Topic/Focus:  Wrap-Up Group:   The focus of this group is to help patients review their daily goal of treatment and discuss progress on daily workbooks.  Participation Level:  Active  Participation Quality:  Appropriate and Attentive  Affect:  Appropriate  Cognitive:  Alert and Appropriate  Insight: Appropriate and Good  Engagement in Group:  Engaged  Modes of Intervention:  Discussion and Education  Additional Comments:  Pt attended and participated in wrap up group this evening and rated their day an 8/10. Pt completed their goal, which was to work on themselves.   Cristi Loron 10/25/2018, 9:27 PM

## 2018-10-25 NOTE — Progress Notes (Signed)
Patient ID: Edward White, male   DOB: 1990/11/02, 28 y.o.   MRN: [id number] D: Patient was in the dayroom playing cards and interacting with peers. Pt was observed banging hands on the table during play. Before bed pt was reports having rt flank pain radiating to right nipple. Pt stated pain was worsen with movement. Vital signs was within normal limits. Denies  SI/HI/AVH.No behavioral issues noted.  A: Support and encouragement offered to rest. Medications administered as prescribed.  R: Patient is safe and cooperative on unit. Will continue to monitor  for safety and stability.

## 2018-10-25 NOTE — Progress Notes (Signed)
[facility] MD Progress Note  10/25/2018 10:28 AM Edward White  MRN:  [id number] Subjective: Patient reports some improvement compared to admission.  Describes some lingering depression/anxiety but denies suicidal ideations/contracts her safety on unit.  Describes some pain on right hand/wrist, including some dysesthesias on thumb. Denies medication side effects. Objective : I have discussed case with treatment team and have met with patient. 28 year old male , history of polysubstance dependence ( cannabis, Xanax ( last used day prior to admission), heroin( last used about a month ago), presented under IVC for drug use, depression, SI and reported HI towards brother ( which he denies ). Does endorse long history of depression, anxiety and recently worsening depression, neuro-vegetative symptoms and passive SI. Recent R wrist drop related to " passing out " with head over arm .  At present patient is alert, attentive, calm, pleasant on approach, vaguely anxious. No current significant symptoms of withdrawal noted.  No significant distal tremors, no restlessness or agitation, no diaphoresis, most recent vitals stable (137/89, pulse 92).  Patient does report feeling "a little jittery".  He also reports some insomnia last night.  Denies medication side effects.  Denies suicidal ideations. Visible on unit and pleasant on approach.  Behavior in good control. Principal Problem: Polysubstance Use Disorder ( BZD, Opiates, Cannabis). Substance Induced Mood Disorder and Anxiety Disorder versus MDD and GAD   Diagnosis: Active Problems:   MDD (major depressive disorder), recurrent episode, severe ([place])  Total Time spent with patient: 20 minutes  Past Psychiatric History:   Past Medical History:  Past Medical History:  Diagnosis Date  . Anxiety   . Bipolar 1 disorder ([place])   . Depression    History reviewed. No pertinent surgical history. Family History: History reviewed. No pertinent family history. Family  Psychiatric  History:  Social History:  Social History   Substance and Sexual Activity  Alcohol Use No     Social History   Substance and Sexual Activity  Drug Use Yes  . Frequency: 5.0 times per week  . Types: Marijuana    Social History   Socioeconomic History  . Marital status: Single    Spouse name: Not on file  . Number of children: Not on file  . Years of education: Not on file  . Highest education level: Not on file  Occupational History  . Not on file  Social Needs  . Financial resource strain: Not on file  . Food insecurity    Worry: Not on file    Inability: Not on file  . Transportation needs    Medical: Not on file    Non-medical: Not on file  Tobacco Use  . Smoking status: Current Every Day Smoker    Packs/day: 0.50    Years: 1.00    Pack years: 0.50    Types: Cigarettes  . Smokeless tobacco: Never Used  Substance and Sexual Activity  . Alcohol use: No  . Drug use: Yes    Frequency: 5.0 times per week    Types: Marijuana  . Sexual activity: Not on file    Comment: pt smokes to help him go to sleep   Lifestyle  . Physical activity    Days per week: Not on file    Minutes per session: Not on file  . Stress: Not on file  Relationships  . Social Herbalist on phone: Not on file    Gets together: Not on file    Attends religious service: Not  on file    Active member of club or organization: Not on file    Attends meetings of clubs or organizations: Not on file    Relationship status: Not on file  Other Topics Concern  . Not on file  Social History Narrative  . Not on file   Additional Social History:   Sleep: Fair  Appetite:  Improving  Current Medications: Current Facility-Administered Medications  Medication Dose Route Frequency Provider Last Rate Last Dose  . acetaminophen (TYLENOL) tablet 650 mg  650 mg Oral Q6H PRN Suella Broad, FNP   650 mg at 10/25/18 0109  . alum & mag hydroxide-simeth (MAALOX/MYLANTA)  200-200-20 MG/5ML suspension 30 mL  30 mL Oral Q4H PRN Starkes-Perry, Gayland Curry, FNP      . gabapentin (NEURONTIN) capsule 200 mg  200 mg Oral TID Cobos, Fernando A, MD      . hydrOXYzine (ATARAX/VISTARIL) tablet 25 mg  25 mg Oral Q6H PRN Cobos, Myer Peer, MD   25 mg at 10/24/18 2108  . LORazepam (ATIVAN) tablet 1 mg  1 mg Oral Q6H PRN Cobos, Myer Peer, MD      . LORazepam (ATIVAN) tablet 1 mg  1 mg Oral QID Cobos, Myer Peer, MD   1 mg at 10/25/18 0858   Followed by  . LORazepam (ATIVAN) tablet 1 mg  1 mg Oral TID Cobos, Myer Peer, MD       Followed by  . [START ON 10/26/2018] LORazepam (ATIVAN) tablet 1 mg  1 mg Oral BID Cobos, Myer Peer, MD       Followed by  . [START ON 10/28/2018] LORazepam (ATIVAN) tablet 1 mg  1 mg Oral Daily Cobos, Fernando A, MD      . magnesium hydroxide (MILK OF MAGNESIA) suspension 30 mL  30 mL Oral Daily PRN Starkes-Perry, Gayland Curry, FNP      . multivitamin with minerals tablet 1 tablet  1 tablet Oral Daily Cobos, Myer Peer, MD   1 tablet at 10/25/18 0857  . nicotine (NICODERM CQ - dosed in mg/24 hours) patch 21 mg  21 mg Transdermal Daily Cobos, Myer Peer, MD   21 mg at 10/25/18 0857  . sertraline (ZOLOFT) tablet 50 mg  50 mg Oral Daily Cobos, Myer Peer, MD   50 mg at 10/25/18 0857  . silver sulfADIAZINE (SILVADENE) 1 % cream   Topical BID Cobos, Myer Peer, MD   1 application at 50/93/26 0857  . thiamine (VITAMIN B-1) tablet 100 mg  100 mg Oral Daily Cobos, Myer Peer, MD   100 mg at 10/25/18 7124    Lab Results:  Results for orders placed or performed during the hospital encounter of 10/23/18 (from the past 48 hour(s))  Hemoglobin A1c     Status: None   Collection Time: 10/24/18  6:53 AM  Result Value Ref Range   Hgb A1c MFr Bld 5.4 4.8 - 5.6 %    Comment: (NOTE) Pre diabetes:          5.7%-6.4% Diabetes:              >6.4% Glycemic control for   <7.0% adults with diabetes    Mean Plasma Glucose 108.28 mg/dL    Comment: Performed at [facility], [place] [address] [place], [place] [address]  Lipid panel     Status: None   Collection Time: 10/24/18  6:53 AM  Result Value Ref Range   Cholesterol 114 0 - 200 mg/dL  Triglycerides 69 <150 mg/dL   HDL 55 >40 mg/dL   Total CHOL/HDL Ratio 2.1 RATIO   VLDL 14 0 - 40 mg/dL   LDL Cholesterol 45 0 - 99 mg/dL    Comment:        Total Cholesterol/HDL:CHD Risk Coronary Heart Disease Risk Table                     Men   Women  1/2 Average Risk   3.4   3.3  Average Risk       5.0   4.4  2 X Average Risk   9.6   7.1  3 X Average Risk  23.4   11.0        Use the calculated Patient Ratio above and the CHD Risk Table to determine the patient's CHD Risk.        ATP III CLASSIFICATION (LDL):  <100     mg/dL   Optimal  100-129  mg/dL   Near or Above                    Optimal  130-159  mg/dL   Borderline  160-189  mg/dL   High  >190     mg/dL   Very High Performed at [facility] [address], [place], [place] [address]   TSH     Status: None   Collection Time: 10/24/18  6:53 AM  Result Value Ref Range   TSH 0.537 0.350 - 4.500 uIU/mL    Comment: Performed by a 3rd Generation assay with a functional sensitivity of <=0.01 uIU/mL. Performed at [facility], [place] [address], [place], [place] [address]     Blood Alcohol level:  Lab Results  Component Value Date   ETH <10 54/27/0623    Metabolic Disorder Labs: Lab Results  Component Value Date   HGBA1C 5.4 10/24/2018   MPG 108.28 10/24/2018   No results found for: PROLACTIN Lab Results  Component Value Date   CHOL 114 10/24/2018   TRIG 69 10/24/2018   HDL 55 10/24/2018   CHOLHDL 2.1 10/24/2018   VLDL 14 10/24/2018   LDLCALC 45 10/24/2018    Physical Findings: AIMS: Facial and Oral Movements Muscles of Facial Expression: None, normal Lips and Perioral Area: None, normal Jaw: None, normal Tongue: None, normal,Extremity Movements Upper (arms, wrists, hands, fingers): None,  normal Lower (legs, knees, ankles, toes): None, normal, Trunk Movements Neck, shoulders, hips: None, normal, Overall Severity Severity of abnormal movements (highest score from questions above): None, normal Incapacitation due to abnormal movements: None, normal Patient's awareness of abnormal movements (rate only patient's report): No Awareness, Dental Status Current problems with teeth and/or dentures?: No Does patient usually wear dentures?: No  CIWA:  CIWA-Ar Total: 2 COWS:  COWS Total Score: 1  Musculoskeletal: Strength & Muscle Tone: within normal limits no current tremors or diaphoresis noted or reported, does not appear to be in any acute distress Gait & Station: normal Patient leans: N/A  Psychiatric Specialty Exam: Physical Exam  ROS reports some pain on right hand.  No fever, no chills Of note, patient has an area of erythema/excoriation on the skin of left arm, which he states is related to tendency to "scratch when I am anxious"  Blood pressure 137/89, pulse 92, temperature 97.9 F (36.6 C), temperature source Oral, resp. rate 16, height 5' 9" (1.753 m), weight 76.2 kg, SpO2 100 %.Body mass index is 24.81 kg/m.  General  Appearance: Well Groomed  Eye Contact:  Good  Speech:  Normal Rate  Volume:  Normal  Mood:   presents with improving mood compared to admission  Affect:  Vaguely anxious, smiles at times appropriately during session  Thought Process:  Linear and Descriptions of Associations: Intact  Orientation:  Full (Time, Place, and Person)  Thought Content:  No hallucinations, no delusions  Suicidal Thoughts:  No at this time denies suicidal or self-injurious ideations, also denies homicidal or violent ideations, contracts for safety on unit  Homicidal Thoughts:  No  Memory:  Recent and remote grossly intact  Judgement:  Fair/improving  Insight:  Fair  Psychomotor Activity:  Normal-no tremors, no psychomotor agitation or restlessness  Concentration:   Concentration: Good and Attention Span: Good  Recall:  Good  Fund of Knowledge:  Good  Language:  Good  Akathisia:  Negative  Handed:  Right  AIMS (if indicated):     Assets:  Desire for Improvement Resilience  ADL's:  Intact  Cognition:  WNL  Sleep:  Number of Hours: 2.25    Assessment -  28 year old male , history of polysubstance dependence ( cannabis, Xanax ( last used day prior to admission), heroin( last used about a month ago), presented under IVC for drug use, depression, SI and reported HI towards brother ( which he denies ). Does endorse long history of depression, anxiety and recently worsening depression, neuro-vegetative symptoms and passive SI. Recent R wrist drop related to " passing out " with head over arm .  Today patient presenting with partially improved mood, a fuller range of affect although still vaguely anxious.  Denies SI at this time and contracts for safety.  Currently not presenting with significant withdrawal symptoms although does report feeling subjectively "jittery".  Wrist drop/radial nerve compromise noted to be improving with partially improved ability to extend wrist and fingers, although still significant difficulty in extending thumb. He has splint and has been seen by PT. Importance of following up with instructions for rehab reviewed . Denies medication side effects thus far . Treatment Plan Summary: Daily contact with patient to assess and evaluate symptoms and progress in treatment, Medication management, Plan Inpatient treatment and Medications as below Encourage group and milieu participation Encourage efforts to work on sobriety and relapse prevention Increase Neurontin to 200 mg 3 times daily for anxiety and to address pain Continue Ativan detox protocol to minimize risk of BZD withdrawal Continue Zoloft 50 mg daily for depression/anxiety Continue trazodone 50 mg nightly PRN for insomnia Treatment team working on disposition options  Jenne Campus, MD 10/25/2018, 10:28 AM

## 2018-10-25 NOTE — Progress Notes (Signed)
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  He tolerated splinting well.  He was instructed in precautions and how to don/doff, initial HEP, and desensitization for Rt hand.   Will monitor splint for signs of pressure, and ensure pt independent with wear and care.      10/24/18 2200  OT Visit Information  Last OT Received On 10/25/18  Assistance Needed +1  History of Present Illness THis 28 y.o. male admitted to [facility] for depression, anxiety, SI and HI. He passed out with head on his arm, and has decreased ability to extend wrist and thumb.    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   [employer] No difficulties  Pain Assessment  Pain Assessment Faces  Faces Pain Scale 6  Pain Location Rt thumb and hand - parasthesias   Pain Descriptors / Indicators Pins and needles  Pain Intervention(s) Limited activity within patient's tolerance;Monitored during session;Repositioned (Pt instructed in desensitization techniques )  Cognition  Arousal/Alertness Awake/alert  Behavior During Therapy WFL for tasks assessed/performed  Overall Cognitive Status Within Functional Limits for tasks assessed  Upper Extremity Assessment  Upper Extremity Assessment RUE deficits/detail  RUE Deficits / Details wrist 1+/5 - able to initiate small excursion of wrist extension in gravity elminated position.  0/5 MCP and thumb extension - sensory loss, pins and needles sensation in radial nerve distribution   RUE Sensation decreased light touch  RUE Coordination decreased fine  motor  Lower Extremity Assessment  Lower Extremity Assessment Overall WFL for tasks assessed  Cervical / Trunk Assessment  Cervical / Trunk Assessment Normal  ADL  Overall ADL's  Modified independent  Exercises  Exercises Other exercises  Other Exercises  Other Exercises Dynamic MCP extensor assist splint fabricated for Rt UE.  Pt instructed in purpose, wear schedule and to monitor for signs and symptoms of pressure.  He was able to verbalize and demonstrate understanding.  He was able to pick up pen and cell phone with Rt UE with splint in place.    Other Exercises Pt instructed to keep wrist supported at all times when not in wrist splint or dynamic splint to avoid overstretch of carpals  Other Exercises Pt instructed in splint wear and care and verbalized understanding   Other Exercises Pt instructed in desensitization of Rt finger, and to do AAROM Rt wrist   OT - End of Session  Activity Tolerance Patient tolerated treatment well  Patient left Other (comment) (in hallway )  Nurse Communication Other (comment) (splint schedule )  OT Assessment  OT Recommendation/Assessment Patient needs continued OT Services  OT Visit Diagnosis Pain;Muscle weakness (generalized) (M62.81)  Pain - Right/Left Right  Pain - part of body Hand  OT Problem List Decreased strength;Decreased range of motion;Impaired UE functional use;Pain;Impaired sensation  OT Plan  OT Frequency (ACUTE ONLY) Min 2X/week  OT Treatment/Interventions (ACUTE ONLY) Therapeutic exercise;DME and/or AE instruction;Splinting;Manual therapy;Patient/family education  AM-PAC OT "6 Clicks" Daily Activity Outcome Measure (Version 2)  Help from another person eating meals? 4  Help from another person taking care of personal  grooming? 4  Help from another person toileting, which includes using toliet, bedpan, or urinal? 4  Help from another person bathing (including washing, rinsing, drying)? 4  Help from another person to put on and  taking off regular upper body clothing? 4  Help from another person to put on and taking off regular lower body clothing? 4  6 Click Score 24  OT Recommendation  Follow Up Recommendations Outpatient OT  OT Equipment None recommended by OT  Individuals Consulted  Consulted and Agree with Results and Recommendations Patient  Acute Rehab OT Goals  Patient Stated Goal to regain use of Rt UE   OT Goal Formulation With patient  Time For Goal Achievement 11/01/18  Potential to Achieve Goals Good             Jeani HawkingWendi Mahari Vankirk, OTR/L Acute Rehabilitation Services Pager [phone] Office [phone]

## 2018-10-26 ENCOUNTER — Inpatient Hospital Stay (HOSPITAL_COMMUNITY): Payer: Federal, State, Local not specified - Other

## 2018-10-26 DIAGNOSIS — F1920 Other psychoactive substance dependence, uncomplicated: Secondary | ICD-10-CM

## 2018-10-26 MED ORDER — SALINE SPRAY 0.65 % NA SOLN: 1.0000 | NASAL | Status: DC | PRN

## 2018-10-26 MED ORDER — TRAZODONE HCL 150 MG PO TABS
150.0000 mg | ORAL_TABLET | Freq: Every day | ORAL | Status: DC
  Administered 2018-10-26: 150 mg via ORAL
  Filled 2018-10-26: qty 7
  Filled 2018-10-26: qty 1
  Filled 2018-10-26: qty 7
  Filled 2018-10-26: qty 1

## 2018-10-26 NOTE — Progress Notes (Signed)
Patient ID: Edward White, male   DOB: [birth year], 28 y.o.   MRN: [id number] Pt has been intrusive and needy and at times demanding. Pt is superficial and minimizing. Limited insight and judgement. Pt c/o "feeling like my ribs are broken". Pt frequently c/o that "staff is laughing at me". Then approached writer stating that he was "extremely agitated" because it's been too hot to take pt's outside and refused to go to the gym.  Level 3 obs for safety. Redirection and limit setting as needed. Support provided. Med ed reinforced. Pt sent to [facility] radiology for chest xray.  Superficial. Needy.

## 2018-10-26 NOTE — [facility] Group Notes (Signed)
LCSW Group Therapy Note 10/26/2018 11:47 AM  Type of Therapy/Topic: Group Therapy: Feelings about Diagnosis  Participation Level: Active   Description of Group:  This group will allow patients to explore their thoughts and feelings about diagnoses they have received. Patients will be guided to explore their level of understanding and acceptance of these diagnoses. Facilitator will encourage patients to process their thoughts and feelings about the reactions of others to their diagnosis and will guide patients in identifying ways to discuss their diagnosis with significant others in their lives. This group will be process-oriented, with patients participating in exploration of their own experiences, giving and receiving support, and processing challenge from other group members.  Therapeutic Goals: 1. Patient will demonstrate understanding of diagnosis as evidenced by identifying two or more symptoms of the disorder 2. Patient will be able to express two feelings regarding the diagnosis 3. Patient will demonstrate their ability to communicate their needs through discussion and/or role play  Summary of Patient Progress:  Edward White was engaged and participated throughout the group session. Edward White reports he has struggled with anxiety, depression and bipolar disorder.  Patient left group early. He was excused from the group's discussion by nursing staff.     Therapeutic Modalities:  Cognitive Behavioral Therapy Brief Therapy Feelings Identification    Edward White Clinical Social Worker

## 2018-10-26 NOTE — Progress Notes (Signed)
While pt was in the dayroom, Probation officer observed pt sitting at the table playing cards with peers. Pt was observed moving around and excited to play with peers. Pt did not seem to be in any pain. Around 9:30pm pt began complaining to nurse about chest pain and wanting tylenol. Nurse asked writer to get pt vital signs due to them complaining of chest pain. Pt BP, Pulse and O2 was normal. Pt has been restless throughout the evening and stating that they can not sleep, due to being in pain.

## 2018-10-26 NOTE — Progress Notes (Signed)
Recreation Therapy Notes  Date:  9.4.20 Time: 0930 Location: 300 [address] Dayroom  Group Topic: Stress Management  Goal Area(s) Addresses:  Patient will identify positive stress management techniques. Patient will identify benefits of using stress management post d/c.  Behavioral Response: Engaged  Intervention: Stress Management  Activity : Meditation.  LRT played a meditation that focused on the power of being resilient in the face of adversity.  Patients were to listen as meditation played to follow along.  Education:  Stress Management, Discharge Planning.   Education Outcome: Acknowledges Education  Clinical Observations/Feedback: Pt attended and participated in group.     Victorino Sparrow, LRT/CTRS        Victorino Sparrow A 10/26/2018 10:46 AM

## 2018-10-26 NOTE — Progress Notes (Signed)
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  He was admitted on 10/24/2018 after being placed on involuntary commitment by his family secondary to his drug abuse.  Objective: Patient is seen and examined.  Patient is a 28 year old male with the above-stated past psychiatric history who is seen in follow-up.  He has limited insight towards his drug use.  He seems to be very med seeking.  He does endorse a long history of depression, anxiety.  His vital signs are stable right now, he is afebrile.  He does not show any signs or symptoms of withdrawal at this point.  He denied any auditory, visual or tactile hallucinations.  He slept 4.75 hours last night.  His most recent CIWJ from 9/1 was 9.  Review of his laboratories were all essentially normal except for his drug screen which was positive for benzodiazepines, opiates as well as marijuana.  His EKG showed a normal sinus rhythm.  He denied suicidal ideation this morning.  Principal Problem: <principal problem not specified> Diagnosis: Active Problems:   MDD (major depressive disorder), recurrent episode, severe (HCC)  Total Time spent with patient: 20 minutes  Past Psychiatric History: See admission H&P  Past Medical History:  Past Medical History:  Diagnosis Date  . Anxiety   . Bipolar 1 disorder (HCC)   . Depression    History reviewed. No pertinent surgical history. Family History: History reviewed. No pertinent family history. Family Psychiatric  History: See admission H&P Social History:  Social History   Substance and Sexual Activity  Alcohol Use No     Social History   Substance and Sexual Activity  Drug Use Yes  . Frequency: 5.0 times per week  . Types:  Marijuana    Social History   Socioeconomic History  . Marital status: Single    Spouse name: Not on file  . Number of children: Not on file  . Years of education: Not on file  . Highest education level: Not on file  Occupational History  . Not on file  Social Needs  . Financial resource strain: Not on file  . Food insecurity    Worry: Not on file    Inability: Not on file  . Transportation needs    Medical: Not on file    Non-medical: Not on file  Tobacco Use  . Smoking status: Current Every Day Smoker    Packs/day: 0.50    Years: 1.00    Pack years: 0.50    Types: Cigarettes  . Smokeless tobacco: Never Used  Substance and Sexual Activity  . Alcohol use: No  . Drug use: Yes    Frequency: 5.0 times per week    Types: Marijuana  . Sexual activity: Not on file    Comment: pt smokes to help him go to sleep   Lifestyle  . Physical activity    Days per week: Not on file    Minutes per session: Not on file  . Stress: Not on file  Relationships  . Social Musicianconnections    Talks on phone: Not on file    Gets together: Not on file    Attends religious service: Not on file    Active member of club or organization: Not on file    Attends meetings of clubs or organizations: Not  on file    Relationship status: Not on file  Other Topics Concern  . Not on file  Social History Narrative  . Not on file   Additional Social History:                         Sleep: Fair  Appetite:  Fair  Current Medications: Current Facility-Administered Medications  Medication Dose Route Frequency Provider Last Rate Last Dose  . acetaminophen (TYLENOL) tablet 650 mg  650 mg Oral Q6H PRN Suella Broad, FNP   650 mg at 10/26/18 0909  . alum & mag hydroxide-simeth (MAALOX/MYLANTA) 200-200-20 MG/5ML suspension 30 mL  30 mL Oral Q4H PRN Starkes-Perry, Gayland Curry, FNP      . gabapentin (NEURONTIN) capsule 200 mg  200 mg Oral TID Cobos, Myer Peer, MD   200 mg at 10/26/18 0735  .  hydrOXYzine (ATARAX/VISTARIL) tablet 25 mg  25 mg Oral Q6H PRN Cobos, Myer Peer, MD   25 mg at 10/25/18 2131  . LORazepam (ATIVAN) tablet 1 mg  1 mg Oral Q6H PRN Cobos, Fernando A, MD      . magnesium hydroxide (MILK OF MAGNESIA) suspension 30 mL  30 mL Oral Daily PRN Starkes-Perry, Gayland Curry, FNP      . multivitamin with minerals tablet 1 tablet  1 tablet Oral Daily Cobos, Myer Peer, MD   1 tablet at 10/26/18 0735  . nicotine (NICODERM CQ - dosed in mg/24 hours) patch 21 mg  21 mg Transdermal Daily Cobos, Myer Peer, MD   Stopped at 10/26/18 0912  . sertraline (ZOLOFT) tablet 50 mg  50 mg Oral Daily Cobos, Myer Peer, MD   50 mg at 10/26/18 0738  . sodium chloride (OCEAN) 0.65 % nasal spray 1 spray  1 spray Each Nare PRN Sharma Covert, MD      . thiamine (VITAMIN B-1) tablet 100 mg  100 mg Oral Daily Cobos, Myer Peer, MD   100 mg at 10/26/18 0735  . traZODone (DESYREL) tablet 150 mg  150 mg Oral QHS Sharma Covert, MD        Lab Results: No results found for this or any previous visit (from the past 48 hour(s)).  Blood Alcohol level:  Lab Results  Component Value Date   ETH <10 47/82/9562    Metabolic Disorder Labs: Lab Results  Component Value Date   HGBA1C 5.4 10/24/2018   MPG 108.28 10/24/2018   No results found for: PROLACTIN Lab Results  Component Value Date   CHOL 114 10/24/2018   TRIG 69 10/24/2018   HDL 55 10/24/2018   CHOLHDL 2.1 10/24/2018   VLDL 14 10/24/2018   LDLCALC 45 10/24/2018    Physical Findings: AIMS: Facial and Oral Movements Muscles of Facial Expression: None, normal Lips and Perioral Area: None, normal Jaw: None, normal Tongue: None, normal,Extremity Movements Upper (arms, wrists, hands, fingers): None, normal Lower (legs, knees, ankles, toes): None, normal, Trunk Movements Neck, shoulders, hips: None, normal, Overall Severity Severity of abnormal movements (highest score from questions above): None, normal Incapacitation due to abnormal  movements: None, normal Patient's awareness of abnormal movements (rate only patient's report): No Awareness, Dental Status Current problems with teeth and/or dentures?: No Does patient usually wear dentures?: No  CIWA:  CIWA-Ar Total: 9 COWS:  COWS Total Score: 1  Musculoskeletal: Strength & Muscle Tone: within normal limits Gait & Station: normal Patient leans: N/A  Psychiatric Specialty Exam: Physical Exam  Nursing note  and vitals reviewed. Constitutional: He is oriented to person, place, and time. He appears well-developed and well-nourished.  HENT:  Head: Normocephalic and atraumatic.  Respiratory: Effort normal.  Neurological: He is alert and oriented to person, place, and time.    ROS  Blood pressure 131/70, pulse 81, temperature 97.9 F (36.6 C), temperature source Oral, resp. rate 16, height 5\' 9"  (1.753 m), weight 76.2 kg, SpO2 98 %.Body mass index is 24.81 kg/m.  General Appearance: Casual  Eye Contact:  Fair  Speech:  Normal Rate  Volume:  Normal  Mood:  Anxious  Affect:  Congruent  Thought Process:  Coherent and Descriptions of Associations: Circumstantial  Orientation:  Full (Time, Place, and Person)  Thought Content:  Logical  Suicidal Thoughts:  No  Homicidal Thoughts:  No  Memory:  Immediate;   Fair Recent;   Fair Remote;   Fair  Judgement:  Impaired  Insight:  Lacking  Psychomotor Activity:  Increased  Concentration:  Concentration: Fair and Attention Span: Fair  Recall:  [employer] of Knowledge:  Fair  Language:  Good  Akathisia:  Negative  Handed:  Right  AIMS (if indicated):     Assets:  Desire for Improvement Resilience  ADL's:  Intact  Cognition:  WNL  Sleep:  Number of Hours: 4.75     Treatment Plan Summary: Daily contact with patient to assess and evaluate symptoms and progress in treatment, Medication management and Plan : Patient is seen and examined.  Patient is a 28 year old male with the above-stated past medical and psychiatric  history who is seen in follow-up.   Diagnosis: #1 polysubstance dependence, #2 polysubstance withdrawal, #3 substance-induced mood disorder  Patient is seen in follow-up.  His only complaint today is poor sleep.  We will increase his trazodone 250 mg p.o. nightly.  No change in his lorazepam, gabapentin, Zoloft for now.  He is not interested in long-term subs abuse treatment.  He is not agreed to allow [place] to talk with his family despite the fact that they were the ones who placed the involuntary commitment.  Hopefully he will gain some insight while he is here. 1.  Continue gabapentin 200 mg p.o. 3 times daily for chronic pain and mood stability as well as anxiety. 2.  Continue hydroxyzine 25 mg p.o. every 6 hours as needed anxiety. 3.  Continue lorazepam 1 mg p.o. every 6 hours PRN a CIWA greater than 10. 3.  Continue multivitamin 1 tablet p.o. daily for nutritional supplementation. 4.  Continue Zoloft 50 mg p.o. daily for mood and anxiety. 5.  Continue nasal spray secondary to dryness PRN. 6.  Continue thiamine 100 mg p.o. daily for nutritional supplementation. 7.  Increase trazodone 250 mg p.o. nightly for insomnia. 8.  Disposition planning-in progress.  Antonieta Pert, MD 10/26/2018, 11:39 AM

## 2018-10-27 DIAGNOSIS — F1920 Other psychoactive substance dependence, uncomplicated: Secondary | ICD-10-CM

## 2018-10-27 MED ORDER — SERTRALINE HCL 50 MG PO TABS: 50.0000 mg | ORAL_TABLET | Freq: Every day | ORAL | 0 refills | Status: DC

## 2018-10-27 MED ORDER — NICOTINE 21 MG/24HR TD PT24: 21.0000 mg | MEDICATED_PATCH | Freq: Every day | TRANSDERMAL | 0 refills | Status: DC

## 2018-10-27 MED ORDER — TRAZODONE HCL 150 MG PO TABS: 150.0000 mg | ORAL_TABLET | Freq: Every day | ORAL | 0 refills | Status: DC

## 2018-10-27 MED ORDER — GABAPENTIN 100 MG PO CAPS: 200.0000 mg | ORAL_CAPSULE | Freq: Three times a day (TID) | ORAL | 0 refills | Status: DC
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
## 2018-10-27 NOTE — BHH Suicide Risk Assessment (Signed)
[facility] Discharge Suicide Risk Assessment   Principal Problem: MDD (major depressive disorder), recurrent episode, severe ([place]) Discharge Diagnoses: Principal Problem:   MDD (major depressive disorder), recurrent episode, severe ([place])   Total Time spent with patient: 15 minutes  Musculoskeletal: Strength & Muscle Tone: within normal limits Gait & Station: normal Patient leans: N/A  Psychiatric Specialty Exam: Review of Systems  Musculoskeletal: Positive for myalgias.  All other systems reviewed and are negative.   Blood pressure 134/73, pulse 76, temperature 98.2 F (36.8 C), temperature source Oral, resp. rate 16, height 5\' 9"  (1.753 m), weight 76.2 kg, SpO2 98 %.Body mass index is 24.81 kg/m.  General Appearance: Casual  Eye Contact::  Absent  Speech:  Normal Rate409  Volume:  Normal  Mood:  Anxious  Affect:  Congruent  Thought Process:  Coherent and Descriptions of Associations: Intact  Orientation:  Full (Time, Place, and Person)  Thought Content:  Logical  Suicidal Thoughts:  No  Homicidal Thoughts:  No  Memory:  Immediate;   Fair Recent;   Fair Remote;   Fair  Judgement:  Intact  Insight:  Fair  Psychomotor Activity:  Increased  Concentration:  Fair  Recall:  [employer] of Knowledge:Good  Language: Good  Akathisia:  Negative  Handed:  Right  AIMS (if indicated):     Assets:  Desire for Improvement Housing Resilience  Sleep:  Number of Hours: 6.75  Cognition: WNL  ADL's:  Intact   Mental Status Per Nursing Assessment::   On Admission:  NA  Demographic Factors:  Male, Adolescent or young adult, Caucasian, Low socioeconomic status and Unemployed  Loss Factors: NA  Historical Factors: Impulsivity  Risk Reduction Factors:   Living with another person, especially a relative  Continued Clinical Symptoms:  Depression:   Comorbid alcohol abuse/dependence Impulsivity Alcohol/Substance Abuse/Dependencies  Cognitive Features That Contribute To Risk:   None    Suicide Risk:  Minimal: No identifiable suicidal ideation.  Patients presenting with no risk factors but with morbid ruminations; may be classified as minimal risk based on the severity of the depressive symptoms  Follow-up [facility] Follow up on 10/30/2018.   Why: Please follow up for outpatient services during clinics walk-in hours, on Tuesday, 9/8 at 8:30a.  Be sure to bring the following; photo ID, SSN, current medications and discharge paperwork from this hospitalization.  Contact information: [place] [address] [place] Follow up on 11/01/2018.   Why: Follow up appointment with Levada Dy is Thursday, 9/10 at 8:30a.  Please bring your photo ID and current medications.  Contact information: [place] [phone] [phone]          Plan Of Care/Follow-up recommendations:  Activity:  ad lib  Sharma Covert, MD 10/27/2018, 9:25 AM

## 2018-10-27 NOTE — Progress Notes (Signed)
Pt denied SI/HI. Pt received both written and verbal discharge instructions. Pt verbalized understanding of discharge instructions. Pt agreed to f/u appt and med regimen. Pt received sample meds, AVS and a transitional record. Pt prescriptions sent electronically to the pt's pharmacy on file. Pt gathered belongings from room and locker. Pt safely discharged to the lobby.  

## 2018-10-27 NOTE — Progress Notes (Signed)
  [facility] Adult Case Management Discharge Plan :  Will you be returning to the same living situation after discharge:  Yes,  home At discharge, do you have transportation home?: Yes,  friend/family Do you have the ability to pay for your medications: Yes,  mental health  Release of information consent forms completed and submitted to medical records by CSW.  Patient to Follow up at: Follow-up Information    [facility] [place] Follow up on 10/30/2018.   Why: Please follow up for outpatient services during clinics walk-in hours, on Tuesday, 9/8 at 8:30a.  Be sure to bring the following; photo ID, SSN, current medications and discharge paperwork from this hospitalization.  Contact information: [place] [address] [place] Follow up on 11/01/2018.   Why: Follow up appointment with Levada Dy is Thursday, 9/10 at 8:30a.  Please bring your photo ID and current medications.  Contact information: [address] [place] [phone]          Next level of care provider has access to [place] and Suicide Prevention discussed: Yes,  SPE completed with pt and his mother  Have you used any form of tobacco in the last 30 days? (Cigarettes, Smokeless Tobacco, Cigars, and/or Pipes): Yes  Has patient been referred to the Quitline?: Patient refused referral  Patient has been referred for addiction treatment: Yes  Avelina Laine, LCSW 10/27/2018, 9:45 AM
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
## 2018-11-01 ENCOUNTER — Other Ambulatory Visit: Payer: Self-pay

## 2018-11-01 ENCOUNTER — Ambulatory Visit: Payer: Self-pay | Attending: Family Medicine

## 2019-01-31 ENCOUNTER — Observation Stay (HOSPITAL_COMMUNITY)
Admission: RE | Admit: 2019-01-31 | Discharge: 2019-02-01 | Disposition: A | Discharge status: Home / Self Care | Payer: Federal, State, Local not specified - Other | Attending: Psychiatry | Admitting: Psychiatry

## 2019-01-31 ENCOUNTER — Other Ambulatory Visit: Payer: Self-pay

## 2019-01-31 ENCOUNTER — Encounter (HOSPITAL_COMMUNITY): Payer: Self-pay | Admitting: Psychiatry

## 2019-01-31 DIAGNOSIS — F332 Major depressive disorder, recurrent severe without psychotic features: Secondary | ICD-10-CM

## 2019-01-31 DIAGNOSIS — F1190 Opioid use, unspecified, uncomplicated: Secondary | ICD-10-CM

## 2019-01-31 DIAGNOSIS — U071 COVID-19: Secondary | ICD-10-CM | POA: Insufficient documentation

## 2019-01-31 DIAGNOSIS — F319 Bipolar disorder, unspecified: Secondary | ICD-10-CM | POA: Insufficient documentation

## 2019-01-31 DIAGNOSIS — F1920 Other psychoactive substance dependence, uncomplicated: Principal | ICD-10-CM | POA: Insufficient documentation

## 2019-01-31 DIAGNOSIS — F1099 Alcohol use, unspecified with unspecified alcohol-induced disorder: Secondary | ICD-10-CM | POA: Insufficient documentation

## 2019-01-31 DIAGNOSIS — F17210 Nicotine dependence, cigarettes, uncomplicated: Secondary | ICD-10-CM | POA: Insufficient documentation

## 2019-01-31 DIAGNOSIS — Z79899 Other long term (current) drug therapy: Secondary | ICD-10-CM | POA: Insufficient documentation

## 2019-01-31 DIAGNOSIS — Z915 Personal history of self-harm: Secondary | ICD-10-CM | POA: Insufficient documentation

## 2019-01-31 DIAGNOSIS — F411 Generalized anxiety disorder: Secondary | ICD-10-CM

## 2019-01-31 DIAGNOSIS — F19188 Other psychoactive substance abuse with other psychoactive substance-induced disorder: Secondary | ICD-10-CM

## 2019-01-31 DIAGNOSIS — F1199 Opioid use, unspecified with unspecified opioid-induced disorder: Secondary | ICD-10-CM | POA: Insufficient documentation

## 2019-01-31 LAB — RESPIRATORY PANEL BY RT PCR (FLU A&B, COVID)
Influenza A by PCR: NEGATIVE
Influenza B by PCR: NEGATIVE
SARS Coronavirus 2 by RT PCR: POSITIVE — AB

## 2019-01-31 LAB — URINALYSIS, ROUTINE W REFLEX MICROSCOPIC
Bilirubin Urine: NEGATIVE
Glucose, UA: NEGATIVE mg/dL
Hgb urine dipstick: NEGATIVE
Ketones, ur: NEGATIVE mg/dL
Leukocytes,Ua: NEGATIVE
Nitrite: NEGATIVE
Protein, ur: NEGATIVE mg/dL
Specific Gravity, Urine: 1.013 (ref 1.005–1.030)
pH: 6 (ref 5.0–8.0)

## 2019-01-31 LAB — RAPID URINE DRUG SCREEN, HOSP PERFORMED
Amphetamines: NOT DETECTED
Barbiturates: NOT DETECTED
Benzodiazepines: POSITIVE — AB
Cocaine: NOT DETECTED
Opiates: POSITIVE — AB
Tetrahydrocannabinol: POSITIVE — AB

## 2019-01-31 MED ORDER — HYDROXYZINE HCL 25 MG PO TABS
25.0000 mg | ORAL_TABLET | Freq: Four times a day (QID) | ORAL | Status: DC | PRN
  Filled 2019-01-31 (×2): qty 1

## 2019-01-31 MED ORDER — IBUPROFEN 200 MG PO TABS
600.0000 mg | ORAL_TABLET | Freq: Three times a day (TID) | ORAL | Status: DC | PRN
  Filled 2019-01-31: qty 3

## 2019-01-31 MED ORDER — TRAZODONE HCL 50 MG PO TABS
50.0000 mg | ORAL_TABLET | Freq: Every evening | ORAL | Status: DC | PRN
  Filled 2019-01-31: qty 1

## 2019-01-31 MED ORDER — ACETAMINOPHEN 325 MG PO TABS
650.0000 mg | ORAL_TABLET | Freq: Four times a day (QID) | ORAL | Status: DC | PRN
  Filled 2019-01-31: qty 2

## 2019-01-31 MED ORDER — GABAPENTIN 100 MG PO CAPS
200.0000 mg | ORAL_CAPSULE | Freq: Three times a day (TID) | ORAL | Status: DC
  Filled 2019-01-31: qty 2

## 2019-01-31 MED ORDER — NICOTINE 21 MG/24HR TD PT24
21.0000 mg | MEDICATED_PATCH | Freq: Every day | TRANSDERMAL | Status: DC
  Filled 2019-01-31: qty 1

## 2019-01-31 MED ORDER — MAGNESIUM HYDROXIDE 400 MG/5ML PO SUSP
30.0000 mL | Freq: Every day | ORAL | Status: DC | PRN
  Filled 2019-01-31 (×2): qty 30

## 2019-01-31 MED ORDER — SERTRALINE HCL 50 MG PO TABS
50.0000 mg | ORAL_TABLET | Freq: Every day | ORAL | Status: DC
  Filled 2019-01-31: qty 1

## 2019-01-31 MED ORDER — ALUM & MAG HYDROXIDE-SIMETH 200-200-20 MG/5ML PO SUSP
30.0000 mL | ORAL | Status: DC | PRN
  Filled 2019-01-31: qty 30

## 2019-01-31 MED ORDER — TRAZODONE HCL 50 MG PO TABS: 150.0000 mg | ORAL_TABLET | Freq: Every day | ORAL | Status: DC

## 2019-01-31 NOTE — ED Provider Notes (Signed)
[facility]-EMERGENCY DEPT Provider Note   CSN: [id number] Arrival date & time: 01/31/19  2114     History Chief Complaint  Patient presents with  . Addiction Problem    Jeferson Boozer is a 28 y.o. male.  28 year old male here under IVC due to concern for substance abuse.  Patient denies any SI or HI.  Does have a history of bipolar disorder.  States that he used marijuana and took medications to help him sleep.  States he feels fine at this time.  Was seen at behavioral hospital where he tested positive for Covid and was sent here to hold for observation to be seen tomorrow.        Past Medical History:  Diagnosis Date  . Anxiety   . Bipolar 1 disorder (HCC)   . Depression     Patient Active Problem List   Diagnosis Date Noted  . Opioid use disorder (HCC) 01/31/2019  . Polysubstance dependence (HCC)   . MDD (major depressive disorder), recurrent episode, severe (HCC) 10/23/2018    History reviewed. No pertinent surgical history.     History reviewed. No pertinent family history.  Social History   Tobacco Use  . Smoking status: Current Every Day Smoker    Packs/day: 0.50    Years: 1.00    Pack years: 0.50    Types: Cigarettes  . Smokeless tobacco: Never Used  Substance Use Topics  . Alcohol use: No  . Drug use: Yes    Frequency: 5.0 times per week    Types: Marijuana    Home Medications Prior to Admission medications   Medication Sig Start Date End Date Taking? Authorizing Provider  gabapentin (NEURONTIN) 100 MG capsule Take 2 capsules (200 mg total) by mouth 3 (three) times daily. 10/27/18   Patrcia Dolly, FNP  nicotine (NICODERM CQ - DOSED IN MG/24 HOURS) 21 mg/24hr patch Place 1 patch (21 mg total) onto the skin daily. 10/28/18   Patrcia Dolly, FNP  sertraline (ZOLOFT) 50 MG tablet Take 1 tablet (50 mg total) by mouth daily. 10/28/18   Patrcia Dolly, FNP  traZODone (DESYREL) 150 MG tablet Take 1 tablet (150 mg total) by mouth at bedtime.  10/27/18   Patrcia Dolly, FNP    Allergies    Patient has no known allergies.  Review of Systems   Review of Systems  All other systems reviewed and are negative.   Physical Exam Updated Vital Signs BP (!) 143/86 (BP Location: Left Arm)   Pulse 76   Temp 98.2 F (36.8 C) (Oral)   Resp 16   SpO2 100%   Physical Exam Vitals and nursing note reviewed.  Constitutional:      General: He is not in acute distress.    Appearance: Normal appearance. He is well-developed. He is not toxic-appearing.  HENT:     Head: Normocephalic and atraumatic.  Eyes:     General: Lids are normal.     Conjunctiva/sclera: Conjunctivae normal.     Pupils: Pupils are equal, round, and reactive to light.  Neck:     Thyroid: No thyroid mass.     Trachea: No tracheal deviation.  Cardiovascular:     Rate and Rhythm: Normal rate and regular rhythm.     Heart sounds: Normal heart sounds. No murmur. No gallop.   Pulmonary:     Effort: Pulmonary effort is normal. No respiratory distress.     Breath sounds: Normal breath sounds. No stridor. No decreased breath  sounds, wheezing, rhonchi or rales.  Abdominal:     General: Bowel sounds are normal. There is no distension.     Palpations: Abdomen is soft.     Tenderness: There is no abdominal tenderness. There is no rebound.  Musculoskeletal:        General: No tenderness. Normal range of motion.     Cervical back: Normal range of motion and neck supple.  Skin:    General: Skin is warm and dry.     Findings: No abrasion or rash.  Neurological:     Mental Status: He is alert and oriented to person, place, and time.     GCS: GCS eye subscore is 4. GCS verbal subscore is 5. GCS motor subscore is 6.     Cranial Nerves: No cranial nerve deficit.     Sensory: No sensory deficit.  Psychiatric:        Attention and Perception: Attention normal.        Speech: Speech normal.        Behavior: Behavior normal.        Thought Content: Thought content does not  include homicidal or suicidal ideation.     ED Results / Procedures / Treatments   Labs (all labs ordered are listed, but only abnormal results are displayed) Labs Reviewed  RESPIRATORY PANEL BY RT PCR (FLU A&B, COVID) - Abnormal; Notable for the following components:      Result Value   SARS Coronavirus 2 by RT PCR POSITIVE (*)    All other components within normal limits  RAPID URINE DRUG SCREEN, HOSP PERFORMED - Abnormal; Notable for the following components:   Opiates POSITIVE (*)    Benzodiazepines POSITIVE (*)    Tetrahydrocannabinol POSITIVE (*)    All other components within normal limits  URINALYSIS, ROUTINE W REFLEX MICROSCOPIC  CBC  ETHANOL    EKG None  Radiology No results found.  Procedures Procedures (including critical care time)  Medications Ordered in ED Medications  acetaminophen (TYLENOL) tablet 650 mg (has no administration in time range)  alum & mag hydroxide-simeth (MAALOX/MYLANTA) 200-200-20 MG/5ML suspension 30 mL (has no administration in time range)  magnesium hydroxide (MILK OF MAGNESIA) suspension 30 mL (has no administration in time range)  hydrOXYzine (ATARAX/VISTARIL) tablet 25 mg (has no administration in time range)  traZODone (DESYREL) tablet 50 mg (has no administration in time range)    ED Course  I have reviewed the triage vital signs and the nursing notes.  Pertinent labs & imaging results that were available during my care of the patient were reviewed by me and considered in my medical decision making (see chart for details).    MDM Rules/Calculators/A&P     CHA2DS2/VAS Stroke Risk Points      N/A >= 2 Points: High Risk  1 - 1.99 Points: Medium Risk  0 Points: Low Risk    A final score could not be computed because of missing components.: Last  Change: N/A     This score determines the patient's risk of having a stroke if the  patient has atrial fibrillation.      This score is not applicable to this patient. Components  are not  calculated.                  Multiple discussions with behavioral health who requested patient be kept overnight and he will likely rescind IVC and discharged in the morning.  Patient is not short of breath has no respiratory complaints  at this time.  Patient informed Final Clinical Impression(s) / ED Diagnoses Final diagnoses:  Opioid use disorder [facility])    Rx / DC Orders ED Discharge Orders    None       Lacretia Leigh, MD 01/31/19 2213

## 2019-01-31 NOTE — Progress Notes (Signed)
Notified AC Mechele Claude and patient that his COVID test is positive.  Notified housekeeping that room will need a terminal clean.  Pending transport to [facility].  Pt is IVC.
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
## 2019-01-31 NOTE — Progress Notes (Signed)
[facility] at bedside to transport pt to [facility] ED.  AC Mechele Claude reports ED Charge, [place] notified.

## 2019-01-31 NOTE — Plan of Care (Signed)
[facility] Observation Crisis Plan  Reason for Crisis Plan:  Chronic Mental Illness/Medical Illness, Medication Management and Substance Abuse   Plan of Care:  Referral for IOP, Referral for Substance Abuse and Referral for Telepsychiatry/Psychiatric Consult  Family Support:    "my mother and my step dad"   Current Living Environment:  Living Arrangements: Parent  Insurance:   Hospital Account    Name Acct ID Class Status Primary Coverage   Kervens, [employer] [id number] [place] MH/DD/SAS - 3-[place]AY [place]        Guarantor Account (for Hospital Account [id number])    Name Relation to Pt Service Area Active? Acct Type   Vito Berger Self [facility] Yes Behavioral Health   Address Phone       [address] [place], [place] [address] [phone])          Coverage Information (for Hospital Account [id number])    F/O Payor/Plan Precert #   [place] MH/DD/SAS/3-WAY [facility]    Subscriber Subscriber #   Thi, Sisem[place] [id number]   Address Phone   [address] [place], [place] [address] [phone]      Legal Guardian:   "me"  Primary Care Provider:  Patient, No Pcp Per  Current Outpatient Providers:  "I don't have one right now"  Psychiatrist:  Name of Psychiatrist: none report   Counselor/Therapist:  Name of Therapist: none report   Compliant with Medications:  Yes  Additional Information: "I just need to go to [facility] and see if they can start me on my medications. Lot of people have told me about them, the last prescription y'all gave me was $90 and I can't afford that right now".  Keane Police 12/10/20205:55 PM

## 2019-01-31 NOTE — H&P (Addendum)
Psychiatric Admission Assessment Adult  Patient Identification: Edward White  MRN:  [id number]  Date of Evaluation:  01/31/2019  Chief Complaint: "I was IVC'ed by my step-father to come here"  Principal Diagnosis: Polysubstance Use Disorder (BZD, alcohol & Cannabis including opioid drugs. .  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 Substance Induced Mood Disorder and Anxiety Disorder versus MDD and GAD   History of Present Illness: This is an admission assessment to the [facility] observation unit for Edward White, a 28 year old Caucasian male with hx of polysubstance use disorders including opioid drugs. He has been a patient in this [facility] last September, 2020 related to substance use issues & related mood instability. After treatment, he was discharged with a recommendation for an outpatient psychiatric services with [facility] in [place], [place]. However, Edward White is back at [facility] under an IVC petition by his step-father with reports that patient has been mixing, using benzodiazepine & opioid drugs & was apparently found yesterday passed out in their home. He was brought to [facility] for evaluation & possible detoxification treatments.  During this assessment, Edward White presents disheveled, but alert, oriented x 3 & aware of situation. He reports, "I was IVC'ed by my step-father to be here. He said he saw me passed out yesterday. I did not pass out, I was just sleeping. I have problem sleeping, so, I had smoked some weed, took some Tylenol PM & drank some beer, then fell asleep. I have not been doing drugs any more & I don't have the urge to use either. The last time I used drug was a week ago. I used heroin. I just completed detoxification treatment at [facility] 2 weeks ago. I was at [facility] for 6 days, completed detox, was given resources for outpatient service. I'm trying to secure a sponsor. I have hx of anxiety. The last time I was in  this hospital, I was treated & discharged on Sertraline. But, I did not take it. I thought that I did not need it. I don't need to be here. I am not depressed, anxious, suicidal or homicidal. I'm not hearing any voices, seeing things that are not there. I'm not delusional or paranoid. I feel good".  Associated Signs/Symptoms:  Depression Symptoms:  "I'm not depressed or anxious at all"  (Hypo) Manic Symptoms: Denies any symptoms of hypomania.  Anxiety Symptoms: Denies any symptoms of anxiety at this time.  Psychotic Symptoms: Denies any hallucinations, delusional thinking or paranoia.  PTSD Symptoms: Denies PTSD symptoms at this time.    Total Time spent with patient: 1 hour  Past Psychiatric History: Two prior psychiatric admissions for depression/anxiety/substance abuse one year ago & last September.  History of suicide attempts in the past, most recently 4 years ago by overdose. Denies history of psychosis. Denies history of PTSD. Reports history of depression and anxiety, which he describes as chronic and going back to adolescent years. Reports hx of brief episodes of increased energy, unclear whether substance related/ induced. Denies history of violence.  Is the patient at risk to self? No.  Has the patient been a risk to self in the past 6 months? Yes.    Has the patient been a risk to self within the distant past? Yes.    Is the patient a risk to others? No.  Has the patient been a risk to others in the past 6 months? No.  Has the patient been a risk to  others within the distant past? No.   Prior Inpatient Therapy: [facility] , September, 2020 Prior Outpatient Therapy: None currently   Alcohol Screening:   Substance Abuse History in the last 12 months: Yes  Consequences of Substance Abuse: Medical Consequences:  Liver damage, Possible death by overdose Legal Consequences:  Arrests, jail time, Loss of driving privilege. Family Consequences:  Family discord, divorce and or  separation.  Psychological Evaluations: No   Past Medical History: No medical illnesses. Recent peripheral nerve compromise (wrist drop/ R thumb/wrist), which occurred due to " passing out with  my head on my arm"  currently splinted)  NKDA. Past Medical History:  Diagnosis Date  . Anxiety   . Bipolar 1 disorder ([place])   . Depression    No past surgical history on file.  Family History: Father died from cardiac illness when patient was 47. Mother alive. Has one brother.  Family Psychiatric  History: Father had history of alcohol dependence, paternal aunt committed suicide.  Tobacco Screening: Smokes 1/2 PPD.  Social History: 27, single, no children, lives with mother and step father, currently unemployed (recently lost job at [employer] related to [place]). Had a court date in October, 2020 .  Social History   Substance and Sexual Activity  Alcohol Use No     Social History   Substance and Sexual Activity  Drug Use Yes  . Frequency: 5.0 times per week  . Types: Marijuana    Additional Social History:  Allergies:  No Known Allergies  Lab Results:  No results found for this or any previous visit (from the past 48 hour(s)).  Blood Alcohol level:  Lab Results  Component Value Date   ETH <10 20/94/7096   Metabolic Disorder Labs:  Lab Results  Component Value Date   HGBA1C 5.4 10/24/2018   MPG 108.28 10/24/2018   No results found for: PROLACTIN Lab Results  Component Value Date   CHOL 114 10/24/2018   TRIG 69 10/24/2018   HDL 55 10/24/2018   CHOLHDL 2.1 10/24/2018   VLDL 14 10/24/2018   LDLCALC 45 10/24/2018    Current Medications: Current Outpatient Medications  Medication Sig Dispense Refill  . gabapentin (NEURONTIN) 100 MG capsule Take 2 capsules (200 mg total) by mouth 3 (three) times daily. 180 capsule 0  . nicotine (NICODERM CQ - DOSED IN MG/24 HOURS) 21 mg/24hr patch Place 1 patch (21 mg total) onto the skin daily. 28 patch 0  . sertraline  (ZOLOFT) 50 MG tablet Take 1 tablet (50 mg total) by mouth daily. 30 tablet 0  . traZODone (DESYREL) 150 MG tablet Take 1 tablet (150 mg total) by mouth at bedtime. 30 tablet 0   Current Facility-Administered Medications  Medication Dose Route Frequency Provider Last Rate Last Admin  . acetaminophen (TYLENOL) tablet 650 mg  650 mg Oral Q6H PRN Nwoko, Agnes I, NP      . alum & mag hydroxide-simeth (MAALOX/MYLANTA) 200-200-20 MG/5ML suspension 30 mL  30 mL Oral Q4H PRN Nwoko, Agnes I, NP      . hydrOXYzine (ATARAX/VISTARIL) tablet 25 mg  25 mg Oral Q6H PRN Nwoko, Agnes I, NP      . magnesium hydroxide (MILK OF MAGNESIA) suspension 30 mL  30 mL Oral Daily PRN Nwoko, Agnes I, NP      . traZODone (DESYREL) tablet 50 mg  50 mg Oral QHS PRN Encarnacion Slates, NP       PTA Medications: (Not in a hospital admission)   Musculoskeletal:  Strength & Muscle Tone: within normal limits- no significant tremors or diaphoresis, no restlessness or agitation Gait & Station: normal Patient leans: N/A  Psychiatric Specialty Exam: Physical Exam  Nursing note and vitals reviewed. Constitutional: He is oriented to person, place, and time. He appears well-developed.  Cardiovascular: Normal rate.  Respiratory: Effort normal.  Genitourinary:    Genitourinary Comments: Deferred   Musculoskeletal:        General: Normal range of motion.     Cervical back: Normal range of motion.  Neurological: He is alert and oriented to person, place, and time.  Skin:  Scattered reddened bumps to arms/leg areas.    Review of Systems  Constitutional: Negative.  Negative for chills and fever.  HENT: Negative.   Eyes: Negative.   Respiratory: Negative for cough and shortness of breath.   Cardiovascular: Negative.  Negative for chest pain and palpitations.  Gastrointestinal: Negative.  Negative for abdominal pain, constipation, diarrhea, nausea and vomiting.  Genitourinary: Negative.   Musculoskeletal: Negative.  Negative for  myalgias.       Scattered reddened bumps to arms/leg areas.   Neurological: Positive for headaches.  Endo/Heme/Allergies: Negative.   Psychiatric/Behavioral: Positive for substance abuse (hx. opioid use disorder). Negative for depression, hallucinations, memory loss and suicidal ideas. The patient has insomnia. The patient is not nervous/anxious.     Blood pressure 133/86, pulse (!) 104, temperature 99.5 F (37.5 C), resp. rate 16.There is no height or weight on file to calculate BMI.  General Appearance: Disheveled  Eye Contact:  Fair  Speech:  Clear and Coherent and Normal Rate  Volume:  Normal  Mood:  Denies any symptoms of depression or anxiety.  Affect:  Restricted and Restricted  Thought Process:  Coherent, Linear and Descriptions of Associations: Intact  Orientation:  Full (Time, Place, and Person)  Thought Content:  Logical  Suicidal Thoughts:  No, denies any suicidal or self-injurious ideations, denies homicidal or violent ideations, and also specifically denies any HI or violent ideations towards brother  Homicidal Thoughts:  No  Memory:  recent and remote grossly intact   Judgement:  Fair  Insight:  Fair  Psychomotor Activity:  Normal- no significant tremors or diaphoresis , no restlessness or agitation at this time  Concentration:  Concentration: Good and Attention Span: Good  Recall:  Good  Fund of Knowledge:  Good  Language:  Good  Akathisia:  Negative  Handed:  Right  AIMS (if indicated):     Assets:  Desire for Improvement Resilience  ADL's:  Intact  Cognition:  WNL  Sleep:      Treatment Plan Summary: Daily contact with patient to assess and evaluate symptoms and progress in treatment and Medication management.  Treatment Plan/Recommendations:  1. Admit for crisis management and stabilization, estimated length of stay 3-5 days.    2. Medication management to reduce current symptoms to base line and improve the patient's overall level of functioning: See  MAR, Md's SRA & treatment plan.   Observation Level/Precautions:  15 minute checks  Laboratory:  Will obtain U/A, UDS, BAL,  Psychotherapy:  Milieu, group therapy  Medications: See MAR  Consultations: As needed.  Discharge Concerns: - maintaining sobriety.   Estimated LOS: 2-3 days   Other: Admit to the Observation unit   Physician Treatment Plan for Primary Diagnosis: BZD , Opiate, Cannabis Use Disorder. Substance Induced Mood Disorder   Long Term Goal(s): Improvement in symptoms so as ready for discharge  Short Term Goals: Ability to identify changes in lifestyle  to reduce recurrence of condition will improve and Ability to identify triggers associated with substance abuse/mental health issues will improve  Physician Treatment Plan for Secondary Diagnosis: Active Problems:   Opioid use disorder (HCC)  Long Term Goal(s): Improvement in symptoms so as ready for discharge  Short Term Goals: Ability to identify and develop effective coping behaviors will improve, Ability to maintain clinical measurements within normal limits will improve, Compliance with prescribed medications will improve and Ability to identify triggers associated with substance abuse/mental health issues will improve  I certify that inpatient services furnished can reasonably be expected to improve the patient's condition.    Armandina Stammer, NP, PMHNP, FNP-BC 12/10/20203:51 PM  Attest to NP Note

## 2019-01-31 NOTE — Progress Notes (Signed)
Pt is a 28 y/o caucasian male who presents to [facility] Observation Unit under IVC status accompanied by [facility]. Pt pt "I'm here because my mom and my step dad thinks I've been using drugs and that I passed out sleeping on the porch after using. I only used (marijuana and Xenax) a week ago when I got out of [facility]. It made me sick so I didn't use it again". Pt states he bought the Xenax off the street "I don't have a prescript ion for it". Per IVC paper work patient,'opiods & xanax w/needle or snorting, marijuana using daily and parents found him passed out n the porch from using drugs. Pt reports he drinks 1 or 2 bottles of beer and smokes marijuana but his main drug of choice is heroine. States his  Longest time of sobriety has been "couple of months and I've only been using for 2 years now". Pt reports sleep disturbance lately "when I get up in the middle of the night, I have problem going back to sleep, I still take my Trazodone". Emotional support offered to pt. Unit orientation done, routines discussed; pt verbalized understanding. Skin assessment completed, multiple scratch marks / scabs noted all over pt's body "that's from me scratching myself".  Pt's cell phone (only belonging in locker) secured in locker 55 at time of admission. Pt encouraged to voice concerns. Tolerated fluids and meals well. Q 15 minutes safety checks initiated without self harm gestures or outburst to note at this time.

## 2019-02-01 NOTE — Consult Note (Signed)
[facility] Psych ED Discharge  02/01/2019 10:17 AM Edward White  MRN:  [id number] Principal Problem: Polysubstance dependence [facility]) Discharge Diagnoses: Principal Problem:   Polysubstance dependence ([place]) Active Problems:   Opioid use disorder ([place])   Subjective: Patient assessed by nurse practitioner, along with Dr. Dwyane Dee.  Patient alert and oriented for assessment, answers appropriately.  Patient states "last night I took 2 Advil PM and smoked weed then I fell asleep, my stepdad wanted me to come to the emergency department to be checked out."  Patient endorses recent stay at [facility], states "I have only been using weed since I got out."  Patient denies suicidal and homicidal ideations.  Patient denies access to weapons.  Patient reports living with mother and stepfather.  Patient reports taking prescription medications as ordered and following up with outpatient psychiatry. Patient aware of Covid positive status, plans to quarantine at [facility] house for the next 14 days.  Total Time spent with patient: 30 minutes  Past Psychiatric History: Polysubstance dependence, opioid use disorder, major depressive disorder  Past Medical History:  Past Medical History:  Diagnosis Date  . Anxiety   . Bipolar 1 disorder ([place])   . Depression    History reviewed. No pertinent surgical history. Family History: History reviewed. No pertinent family history. Family Psychiatric  History: Unknown Social History:  Social History   Substance and Sexual Activity  Alcohol Use No     Social History   Substance and Sexual Activity  Drug Use Yes  . Frequency: 5.0 times per week  . Types: Marijuana    Social History   Socioeconomic History  . Marital status: Single    Spouse name: Not on file  . Number of children: Not on file  . Years of education: Not on file  . Highest education level: Not on file  Occupational History  . Not on file  Tobacco Use  . Smoking status: Current Every Day Smoker     Packs/day: 0.50    Years: 1.00    Pack years: 0.50    Types: Cigarettes  . Smokeless tobacco: Never Used  Substance and Sexual Activity  . Alcohol use: No  . Drug use: Yes    Frequency: 5.0 times per week    Types: Marijuana  . Sexual activity: Not on file    Comment: pt smokes to help him go to sleep   Other Topics Concern  . Not on file  Social History Narrative  . Not on file   Social Determinants of Health   Financial Resource Strain:   . Difficulty of Paying Living Expenses: Not on file  Food Insecurity:   . Worried About Charity fundraiser in the Last Year: Not on file  . Ran Out of Food in the Last Year: Not on file  Transportation Needs:   . Lack of Transportation (Medical): Not on file  . Lack of Transportation (Non-Medical): Not on file  Physical Activity:   . Days of Exercise per Week: Not on file  . Minutes of Exercise per Session: Not on file  Stress:   . Feeling of Stress : Not on file  Social Connections:   . Frequency of Communication with Friends and Family: Not on file  . Frequency of Social Gatherings with Friends and Family: Not on file  . Attends Religious Services: Not on file  . Active Member of Clubs or Organizations: Not on file  . Attends Archivist Meetings: Not on file  . Marital Status:  Not on file    Has this patient used any form of tobacco in the last 30 days? (Cigarettes, Smokeless Tobacco, Cigars, and/or Pipes) A prescription for an FDA-approved tobacco cessation medication was offered at discharge and the patient refused  Current Medications: Current Facility-Administered Medications  Medication Dose Route Frequency Provider Last Rate Last Admin  . acetaminophen (TYLENOL) tablet 650 mg  650 mg Oral Q6H PRN Armandina Stammer I, NP      . alum & mag hydroxide-simeth (MAALOX/MYLANTA) 200-200-20 MG/5ML suspension 30 mL  30 mL Oral Q4H PRN Nwoko, Agnes I, NP      . gabapentin (NEURONTIN) capsule 200 mg  200 mg Oral TID Lorre Nick, MD      . hydrOXYzine (ATARAX/VISTARIL) tablet 25 mg  25 mg Oral Q6H PRN Armandina Stammer I, NP      . ibuprofen (ADVIL) tablet 600 mg  600 mg Oral Q8H PRN Lorre Nick, MD      . magnesium hydroxide (MILK OF MAGNESIA) suspension 30 mL  30 mL Oral Daily PRN Armandina Stammer I, NP      . nicotine (NICODERM CQ - dosed in mg/24 hours) patch 21 mg  21 mg Transdermal Daily Lorre Nick, MD      . sertraline (ZOLOFT) tablet 50 mg  50 mg Oral Daily Lorre Nick, MD       Current Outpatient Medications  Medication Sig Dispense Refill  . gabapentin (NEURONTIN) 100 MG capsule Take 2 capsules (200 mg total) by mouth 3 (three) times daily. 180 capsule 0  . nicotine (NICODERM CQ - DOSED IN MG/24 HOURS) 21 mg/24hr patch Place 1 patch (21 mg total) onto the skin daily. 28 patch 0  . sertraline (ZOLOFT) 50 MG tablet Take 1 tablet (50 mg total) by mouth daily. 30 tablet 0  . traZODone (DESYREL) 150 MG tablet Take 1 tablet (150 mg total) by mouth at bedtime. 30 tablet 0   PTA Medications: (Not in a hospital admission)   Musculoskeletal: Strength & Muscle Tone: within normal limits Gait & Station: normal Patient leans: N/A  Psychiatric Specialty Exam: Physical Exam Vitals and nursing note reviewed.  Constitutional:      Appearance: He is well-developed.  HENT:     Head: Normocephalic.  Cardiovascular:     Rate and Rhythm: Normal rate.  Pulmonary:     Effort: Pulmonary effort is normal.  Neurological:     Mental Status: He is alert and oriented to person, place, and time.  Psychiatric:        Mood and Affect: Mood normal.        Behavior: Behavior normal.        Thought Content: Thought content normal.        Judgment: Judgment normal.     Review of Systems  Constitutional: Negative.   HENT: Negative.   Eyes: Negative.   Respiratory: Negative.   Cardiovascular: Negative.   Gastrointestinal: Negative.   Genitourinary: Negative.   Musculoskeletal: Negative.   Skin: Negative.    Neurological: Negative.     Blood pressure 123/73, pulse 68, temperature 98.5 F (36.9 C), temperature source Oral, resp. rate 18, SpO2 96 %.There is no height or weight on file to calculate BMI.  General Appearance: Casual  Eye Contact:  Good  Speech:  Clear and Coherent and Normal Rate  Volume:  Normal  Mood:  Euthymic  Affect:  Appropriate and Congruent  Thought Process:  Coherent, Goal Directed and Descriptions of Associations: Intact  Orientation:  Full (  Time, Place, and Person)  Thought Content:  WDL and Logical  Suicidal Thoughts:  No  Homicidal Thoughts:  No  Memory:  Immediate;   Good Recent;   Good Remote;   Good  Judgement:  Good  Insight:  Fair  Psychomotor Activity:  Normal  Concentration:  Concentration: Good  Recall:  Good  Fund of Knowledge:  Good  Language:  Good  Akathisia:  No  Handed:  Right  AIMS (if indicated):     Assets:  Communication Skills Desire for Improvement Financial Resources/Insurance Housing Social Support Talents/Skills  ADL's:  Intact  Cognition:  WNL  Sleep:        Demographic Factors:  Male and Caucasian  Loss Factors: NA  Historical Factors: NA  Risk Reduction Factors:   Sense of responsibility to family, Living with another person, especially a relative, Positive social support, Positive therapeutic relationship and Positive coping skills or problem solving skills  Continued Clinical Symptoms:  Alcohol/Substance Abuse/Dependencies  Cognitive Features That Contribute To Risk:  None    Suicide Risk:  Minimal: No identifiable suicidal ideation.  Patients presenting with no risk factors but with morbid ruminations; may be classified as minimal risk based on the severity of the depressive symptoms    Plan Of Care/Follow-up recommendations:  Other:  Follow-up with established outpatient psychiatry  Disposition: Discharge Patrcia Dollyina L Tate, FNP 02/01/2019, 10:17 AM

## 2019-02-10 ENCOUNTER — Emergency Department (HOSPITAL_COMMUNITY)
Admission: EM | Admit: 2019-02-10 | Discharge: 2019-02-10 | Disposition: A | Discharge status: Home / Self Care | Payer: Self-pay | Attending: Emergency Medicine | Admitting: Emergency Medicine

## 2019-02-10 ENCOUNTER — Encounter (HOSPITAL_COMMUNITY): Payer: Self-pay | Admitting: Emergency Medicine

## 2019-02-10 ENCOUNTER — Other Ambulatory Visit: Payer: Self-pay

## 2019-02-10 ENCOUNTER — Emergency Department (HOSPITAL_COMMUNITY): Payer: Self-pay

## 2019-02-10 DIAGNOSIS — F17210 Nicotine dependence, cigarettes, uncomplicated: Secondary | ICD-10-CM | POA: Insufficient documentation

## 2019-02-10 DIAGNOSIS — L03113 Cellulitis of right upper limb: Secondary | ICD-10-CM | POA: Insufficient documentation

## 2019-02-10 DIAGNOSIS — U071 COVID-19: Secondary | ICD-10-CM | POA: Insufficient documentation

## 2019-02-10 DIAGNOSIS — L03114 Cellulitis of left upper limb: Secondary | ICD-10-CM | POA: Insufficient documentation

## 2019-02-10 DIAGNOSIS — F1910 Other psychoactive substance abuse, uncomplicated: Secondary | ICD-10-CM | POA: Insufficient documentation

## 2019-02-10 LAB — CBC WITH DIFFERENTIAL/PLATELET
Abs Immature Granulocytes: 0.06 10*3/uL (ref 0.00–0.07)
Basophils Absolute: 0 10*3/uL (ref 0.0–0.1)
Basophils Relative: 0 %
Eosinophils Absolute: 0 10*3/uL (ref 0.0–0.5)
Eosinophils Relative: 0 %
HCT: 41.4 % (ref 39.0–52.0)
Hemoglobin: 14 g/dL (ref 13.0–17.0)
Immature Granulocytes: 1 %
Lymphocytes Relative: 5 %
Lymphs Abs: 0.7 10*3/uL (ref 0.7–4.0)
MCH: 30 pg (ref 26.0–34.0)
MCHC: 33.8 g/dL (ref 30.0–36.0)
MCV: 88.7 fL (ref 80.0–100.0)
Monocytes Absolute: 0.2 10*3/uL (ref 0.1–1.0)
Monocytes Relative: 2 %
Neutro Abs: 11.6 10*3/uL — ABNORMAL HIGH (ref 1.7–7.7)
Neutrophils Relative %: 92 %
Platelets: 299 10*3/uL (ref 150–400)
RBC: 4.67 MIL/uL (ref 4.22–5.81)
RDW: 12.6 % (ref 11.5–15.5)
WBC: 12.6 10*3/uL — ABNORMAL HIGH (ref 4.0–10.5)
nRBC: 0 % (ref 0.0–0.2)

## 2019-02-10 LAB — LACTIC ACID, PLASMA: Lactic Acid, Venous: 1.1 mmol/L (ref 0.5–1.9)

## 2019-02-10 LAB — BASIC METABOLIC PANEL
Anion gap: 15 (ref 5–15)
BUN: 12 mg/dL (ref 6–20)
CO2: 24 mmol/L (ref 22–32)
Calcium: 9.5 mg/dL (ref 8.9–10.3)
Chloride: 98 mmol/L (ref 98–111)
Creatinine, Ser: 0.68 mg/dL (ref 0.61–1.24)
GFR calc Af Amer: >60 mL/min (ref >60)
GFR calc non Af Amer: >60 mL/min (ref >60)
Glucose, Bld: 103 mg/dL — ABNORMAL HIGH (ref 70–99)
Potassium: 3.6 mmol/L (ref 3.5–5.1)
Sodium: 137 mmol/L (ref 135–145)

## 2019-02-10 MED ORDER — ONDANSETRON 4 MG PO TBDP: 4.0000 mg | ORAL_TABLET | Freq: Three times a day (TID) | ORAL | 0 refills | Status: DC | PRN

## 2019-02-10 MED ORDER — SULFAMETHOXAZOLE-TRIMETHOPRIM 800-160 MG PO TABS: 1.0000 | ORAL_TABLET | Freq: Two times a day (BID) | ORAL | 0 refills | Status: AC

## 2019-02-10 MED ORDER — ONDANSETRON HCL 4 MG/2ML IJ SOLN
4.0000 mg | Freq: Once | INTRAMUSCULAR | Status: AC
  Administered 2019-02-10: 4 mg via INTRAVENOUS
  Filled 2019-02-10: qty 2

## 2019-02-10 MED ORDER — SODIUM CHLORIDE 0.9 % IV BOLUS
1000.0000 mL | Freq: Once | INTRAVENOUS | Status: AC
  Administered 2019-02-10: 09:00:00 1000 mL via INTRAVENOUS

## 2019-02-10 MED ORDER — VANCOMYCIN HCL IN DEXTROSE 1-5 GM/200ML-% IV SOLN
1000.0000 mg | Freq: Once | INTRAVENOUS | Status: AC
  Administered 2019-02-10: 1000 mg via INTRAVENOUS
  Filled 2019-02-10: qty 200

## 2019-02-10 MED ORDER — ACETAMINOPHEN 500 MG PO TABS
1000.0000 mg | ORAL_TABLET | Freq: Once | ORAL | Status: AC
  Administered 2019-02-10: 1000 mg via ORAL
  Filled 2019-02-10: qty 2

## 2019-02-10 NOTE — ED Notes (Signed)
Pt made aware urine needed. Pt provided with urinal. Pt has call bell and phone within reach.

## 2019-02-10 NOTE — ED Triage Notes (Signed)
Pt was Covid+ 10 days ago. Last night was having chills and nausea/vomiting. Pt c/o  back pains.

## 2019-02-10 NOTE — ED Provider Notes (Signed)
[facility]-EMERGENCY DEPT Provider Note   CSN: [id number] Arrival date & time: 02/10/19  2694     History Chief Complaint  Patient presents with   Chills   COvid+    10 days ago    Nausea    Edward White is a 28 y.o. male.  Pt presents to the ED today with fever, chills, and nausea.  The pt had a positive covid test on 12/10, but was asymptomatic from that infection.  His f/c started yesterday.  Pt also has a hx of opiate use disorder and started using again a few days ago.  He has some red areas to his right arms and to his left hand.  He denies sob.  No cough.        Past Medical History:  Diagnosis Date   Anxiety    Bipolar 1 disorder [facility])    Depression     Patient Active Problem List   Diagnosis Date Noted   Opioid use disorder (HCC) 01/31/2019   Polysubstance dependence (HCC)    MDD (major depressive disorder), recurrent episode, severe (HCC) 10/23/2018    History reviewed. No pertinent surgical history.     No family history on file.  Social History   Tobacco Use   Smoking status: Current Every Day Smoker    Packs/day: 0.50    Years: 1.00    Pack years: 0.50    Types: Cigarettes   Smokeless tobacco: Never Used  Substance Use Topics   Alcohol use: No   Drug use: Yes    Frequency: 5.0 times per week    Types: Marijuana    Home Medications Prior to Admission medications   Medication Sig Start Date End Date Taking? Authorizing Provider  acetaminophen (TYLENOL) 325 MG tablet Take 650 mg by mouth every 6 (six) hours as needed for mild pain or headache.   Yes [provider]  ibuprofen (ADVIL) 200 MG tablet Take 400 mg by mouth every 6 (six) hours as needed for fever, headache or moderate pain.   Yes [provider]  gabapentin (NEURONTIN) 100 MG capsule Take 2 capsules (200 mg total) by mouth 3 (three) times daily. Patient not taking: Reported on 02/10/2019 10/27/18   Patrcia Dolly, FNP  nicotine  (NICODERM CQ - DOSED IN MG/24 HOURS) 21 mg/24hr patch Place 1 patch (21 mg total) onto the skin daily. Patient not taking: Reported on 02/10/2019 10/28/18   Patrcia Dolly, FNP  ondansetron (ZOFRAN ODT) 4 MG disintegrating tablet Take 1 tablet (4 mg total) by mouth every 8 (eight) hours as needed. 02/10/19   Jacalyn Lefevre, MD  sertraline (ZOLOFT) 50 MG tablet Take 1 tablet (50 mg total) by mouth daily. Patient not taking: Reported on 02/10/2019 10/28/18   Patrcia Dolly, FNP  sulfamethoxazole-trimethoprim (BACTRIM DS) 800-160 MG tablet Take 1 tablet by mouth 2 (two) times daily for 7 days. 02/10/19 02/17/19  Jacalyn Lefevre, MD  traZODone (DESYREL) 150 MG tablet Take 1 tablet (150 mg total) by mouth at bedtime. Patient not taking: Reported on 02/10/2019 10/27/18   Patrcia Dolly, FNP    Allergies    Haloperidol  Review of Systems   Review of Systems  Constitutional: Positive for chills and fever.  Gastrointestinal: Positive for nausea.  Skin: Positive for rash.  All other systems reviewed and are negative.   Physical Exam Updated Vital Signs BP (!) 145/68 (BP Location: Left Arm)    Pulse (!) 102    Temp 98.8  F (37.1 C) (Oral)    Resp 19    SpO2 100%   Physical Exam Vitals and nursing note reviewed.  Constitutional:      Appearance: Normal appearance.  HENT:     Head: Normocephalic and atraumatic.     Right Ear: External ear normal.     Left Ear: External ear normal.     Nose: Nose normal.     Mouth/Throat:     Mouth: Mucous membranes are dry.  Eyes:     Extraocular Movements: Extraocular movements intact.     Conjunctiva/sclera: Conjunctivae normal.     Pupils: Pupils are equal, round, and reactive to light.  Cardiovascular:     Rate and Rhythm: Regular rhythm. Tachycardia present.     Pulses: Normal pulses.     Heart sounds: Normal heart sounds.  Pulmonary:     Effort: Pulmonary effort is normal.     Breath sounds: Normal breath sounds.  Abdominal:     General: Abdomen is  flat. Bowel sounds are normal.     Palpations: Abdomen is soft.  Musculoskeletal:        General: Normal range of motion.     Cervical back: Normal range of motion and neck supple.  Skin:    Capillary Refill: Capillary refill takes less than 2 seconds.     Comments: Track marks to both arms.  Right AC and Right UE with cellulitis.  No abscess to drain.  Left hand with cellulitis.  No abscess.  Neurological:     General: No focal deficit present.     Mental Status: He is alert and oriented to person, place, and time.  Psychiatric:        Mood and Affect: Mood normal.        Behavior: Behavior normal.     ED Results / Procedures / Treatments   Labs (all labs ordered are listed, but only abnormal results are displayed) Labs Reviewed  BASIC METABOLIC PANEL - Abnormal; Notable for the following components:      Result Value   Glucose, Bld 103 (*)    All other components within normal limits  CBC WITH DIFFERENTIAL/PLATELET - Abnormal; Notable for the following components:   WBC 12.6 (*)    Neutro Abs 11.6 (*)    All other components within normal limits  CULTURE, BLOOD (ROUTINE X 2)  CULTURE, BLOOD (ROUTINE X 2)  LACTIC ACID, PLASMA  LACTIC ACID, PLASMA  URINALYSIS, ROUTINE W REFLEX MICROSCOPIC  RAPID URINE DRUG SCREEN, HOSP PERFORMED    EKG None  Radiology DG Chest Portable 1 View  Result Date: 02/10/2019 CLINICAL DATA:  COVID positive 10 days ago EXAM: PORTABLE CHEST 1 VIEW COMPARISON:  10/26/2018 FINDINGS: Cardiomediastinal contours are normal. Lungs are clear. No sign of pleural effusion. No acute bone finding. IMPRESSION: No acute cardiopulmonary disease. Electronically Signed   By: Donzetta KohutGeoffrey  Wile M.D.   On: 02/10/2019 09:26    Procedures Procedures (including critical care time)  Medications Ordered in ED Medications  sodium chloride 0.9 % bolus 1,000 mL (1,000 mLs Intravenous Bolus 02/10/19 0929)  acetaminophen (TYLENOL) tablet 1,000 mg (1,000 mg Oral Given  02/10/19 0928)  vancomycin (VANCOCIN) IVPB 1000 mg/200 mL premix (1,000 mg Intravenous New Bag/Given 02/10/19 0930)  ondansetron (ZOFRAN) injection 4 mg (4 mg Intravenous Given 02/10/19 [id number])    ED Course  I have reviewed the triage vital signs and the nursing notes.  Pertinent labs & imaging results that were available during my care of the  patient were reviewed by me and considered in my medical decision making (see chart for details).    MDM Rules/Calculators/A&P                      Pt has been given 1g of vancomycin while in ED.  He has no pna on cxr.  I think his chills/fever are from the cellulitis as opposed to covid.  He has a minimal cough and no sob.  Pt is encouraged to not use drugs and to take his abx.  If his sx worsen, return to the ED.  Edel Rivero was evaluated in Emergency Department on 02/10/2019 for the symptoms described in the history of present illness. He was evaluated in the context of the global COVID-19 pandemic, which necessitated consideration that the patient might be at risk for infection with the SARS-CoV-2 virus that causes COVID-19. Institutional protocols and algorithms that pertain to the evaluation of patients at risk for COVID-19 are in a state of rapid change based on information released by regulatory bodies including the CDC and federal and state organizations. These policies and algorithms were followed during the patient's care in the ED. Final Clinical Impression(s) / ED Diagnoses Final diagnoses:  Cellulitis of right upper extremity  Cellulitis of left hand  IV drug abuse ([place])  COVID-19 virus infection    Rx / DC Orders ED Discharge Orders         Ordered    sulfamethoxazole-trimethoprim (BACTRIM DS) 800-160 MG tablet  2 times daily     02/10/19 1030    ondansetron (ZOFRAN ODT) 4 MG disintegrating tablet  Every 8 hours PRN     02/10/19 1030           Isla Pence, MD 02/10/19 1031

## 2019-02-15 LAB — CULTURE, BLOOD (ROUTINE X 2)
Culture: NO GROWTH
Culture: NO GROWTH
Special Requests: ADEQUATE
Special Requests: ADEQUATE

## 2019-03-09 ENCOUNTER — Other Ambulatory Visit: Payer: Self-pay

## 2019-03-09 ENCOUNTER — Observation Stay (HOSPITAL_COMMUNITY)
Admission: AD | Admit: 2019-03-09 | Discharge: 2019-03-09 | Disposition: A | Discharge status: Home / Self Care | Payer: Self-pay | Attending: Psychiatry | Admitting: Psychiatry

## 2019-03-09 ENCOUNTER — Ambulatory Visit (HOSPITAL_COMMUNITY): Payer: Self-pay

## 2019-03-09 ENCOUNTER — Encounter (HOSPITAL_COMMUNITY): Payer: Self-pay | Admitting: Nurse Practitioner

## 2019-03-09 DIAGNOSIS — Z79899 Other long term (current) drug therapy: Secondary | ICD-10-CM | POA: Insufficient documentation

## 2019-03-09 DIAGNOSIS — F1190 Opioid use, unspecified, uncomplicated: Secondary | ICD-10-CM | POA: Diagnosis present

## 2019-03-09 DIAGNOSIS — F1120 Opioid dependence, uncomplicated: Secondary | ICD-10-CM

## 2019-03-09 DIAGNOSIS — F17210 Nicotine dependence, cigarettes, uncomplicated: Secondary | ICD-10-CM | POA: Insufficient documentation

## 2019-03-09 DIAGNOSIS — F419 Anxiety disorder, unspecified: Secondary | ICD-10-CM | POA: Insufficient documentation

## 2019-03-09 DIAGNOSIS — F1994 Other psychoactive substance use, unspecified with psychoactive substance-induced mood disorder: Principal | ICD-10-CM | POA: Diagnosis present

## 2019-03-09 DIAGNOSIS — F1290 Cannabis use, unspecified, uncomplicated: Secondary | ICD-10-CM | POA: Insufficient documentation

## 2019-03-09 DIAGNOSIS — F4323 Adjustment disorder with mixed anxiety and depressed mood: Secondary | ICD-10-CM

## 2019-03-09 DIAGNOSIS — Z20822 Contact with and (suspected) exposure to covid-19: Secondary | ICD-10-CM | POA: Insufficient documentation

## 2019-03-09 DIAGNOSIS — G4700 Insomnia, unspecified: Secondary | ICD-10-CM | POA: Insufficient documentation

## 2019-03-09 DIAGNOSIS — F1199 Opioid use, unspecified with unspecified opioid-induced disorder: Secondary | ICD-10-CM

## 2019-03-09 DIAGNOSIS — F319 Bipolar disorder, unspecified: Secondary | ICD-10-CM | POA: Insufficient documentation

## 2019-03-09 LAB — COMPREHENSIVE METABOLIC PANEL
ALT: 15 U/L (ref 0–44)
AST: 15 U/L (ref 15–41)
Albumin: 3.7 g/dL (ref 3.5–5.0)
Alkaline Phosphatase: 62 U/L (ref 38–126)
Anion gap: 7 (ref 5–15)
BUN: 12 mg/dL (ref 6–20)
CO2: 30 mmol/L (ref 22–32)
Calcium: 9.4 mg/dL (ref 8.9–10.3)
Chloride: 102 mmol/L (ref 98–111)
Creatinine, Ser: 0.81 mg/dL (ref 0.61–1.24)
GFR calc Af Amer: >60 mL/min (ref >60)
GFR calc non Af Amer: >60 mL/min (ref >60)
Glucose, Bld: 93 mg/dL (ref 70–99)
Potassium: 3.8 mmol/L (ref 3.5–5.1)
Sodium: 139 mmol/L (ref 135–145)
Total Bilirubin: 0.4 mg/dL (ref 0.3–1.2)
Total Protein: 7.2 g/dL (ref 6.5–8.1)

## 2019-03-09 LAB — CBC
HCT: 37.8 % — ABNORMAL LOW (ref 39.0–52.0)
Hemoglobin: 11.8 g/dL — ABNORMAL LOW (ref 13.0–17.0)
MCH: 28.8 pg (ref 26.0–34.0)
MCHC: 31.2 g/dL (ref 30.0–36.0)
MCV: 92.2 fL (ref 80.0–100.0)
Platelets: 300 10*3/uL (ref 150–400)
RBC: 4.1 MIL/uL — ABNORMAL LOW (ref 4.22–5.81)
RDW: 13.5 % (ref 11.5–15.5)
WBC: 7.5 10*3/uL (ref 4.0–10.5)
nRBC: 0 % (ref 0.0–0.2)

## 2019-03-09 LAB — RESPIRATORY PANEL BY RT PCR (FLU A&B, COVID)
Influenza A by PCR: NEGATIVE
Influenza B by PCR: NEGATIVE
SARS Coronavirus 2 by RT PCR: NEGATIVE

## 2019-03-09 MED ORDER — DICYCLOMINE HCL 20 MG PO TABS: 20.0000 mg | ORAL_TABLET | Freq: Four times a day (QID) | ORAL | Status: DC | PRN

## 2019-03-09 MED ORDER — ONDANSETRON 4 MG PO TBDP: 4.0000 mg | ORAL_TABLET | Freq: Four times a day (QID) | ORAL | Status: DC | PRN

## 2019-03-09 MED ORDER — GABAPENTIN 100 MG PO CAPS: 200.0000 mg | ORAL_CAPSULE | Freq: Three times a day (TID) | ORAL | Status: DC

## 2019-03-09 MED ORDER — TRAZODONE HCL 50 MG PO TABS: 50.0000 mg | ORAL_TABLET | Freq: Every evening | ORAL | Status: DC | PRN

## 2019-03-09 MED ORDER — GABAPENTIN 100 MG PO CAPS
200.0000 mg | ORAL_CAPSULE | Freq: Three times a day (TID) | ORAL | Status: DC
  Administered 2019-03-09: 04:00:00 200 mg via ORAL
  Filled 2019-03-09: qty 2

## 2019-03-09 MED ORDER — ACETAMINOPHEN 325 MG PO TABS: 650.0000 mg | ORAL_TABLET | Freq: Four times a day (QID) | ORAL | Status: DC | PRN

## 2019-03-09 MED ORDER — ALUM & MAG HYDROXIDE-SIMETH 200-200-20 MG/5ML PO SUSP: 30.0000 mL | ORAL | Status: DC | PRN

## 2019-03-09 MED ORDER — CLONIDINE HCL 0.1 MG PO TABS: 0.1000 mg | ORAL_TABLET | ORAL | Status: DC

## 2019-03-09 MED ORDER — LOPERAMIDE HCL 2 MG PO CAPS: 2.0000 mg | ORAL_CAPSULE | ORAL | Status: DC | PRN

## 2019-03-09 MED ORDER — SERTRALINE HCL 50 MG PO TABS: 50.0000 mg | ORAL_TABLET | Freq: Every day | ORAL | Status: DC

## 2019-03-09 MED ORDER — NAPROXEN 250 MG PO TABS: 500.0000 mg | ORAL_TABLET | Freq: Two times a day (BID) | ORAL | Status: DC | PRN

## 2019-03-09 MED ORDER — NICOTINE 21 MG/24HR TD PT24
21.0000 mg | MEDICATED_PATCH | Freq: Every day | TRANSDERMAL | Status: DC
  Administered 2019-03-09: 21 mg via TRANSDERMAL
  Filled 2019-03-09: qty 1

## 2019-03-09 MED ORDER — CLONIDINE HCL 0.1 MG PO TABS: 0.1000 mg | ORAL_TABLET | Freq: Every day | ORAL | Status: DC

## 2019-03-09 MED ORDER — BUSPIRONE HCL 10 MG PO TABS: 10.0000 mg | ORAL_TABLET | Freq: Two times a day (BID) | ORAL | 0 refills | Status: AC

## 2019-03-09 MED ORDER — HYDROXYZINE HCL 25 MG PO TABS
25.0000 mg | ORAL_TABLET | Freq: Four times a day (QID) | ORAL | Status: DC | PRN
  Administered 2019-03-09: 04:00:00 25 mg via ORAL
  Filled 2019-03-09: qty 1

## 2019-03-09 MED ORDER — TRAZODONE HCL 50 MG PO TABS: 150.0000 mg | ORAL_TABLET | Freq: Every day | ORAL | Status: DC

## 2019-03-09 MED ORDER — IBUPROFEN 400 MG PO TABS: 400.0000 mg | ORAL_TABLET | Freq: Four times a day (QID) | ORAL | Status: DC | PRN

## 2019-03-09 MED ORDER — CLONIDINE HCL 0.1 MG PO TABS
0.1000 mg | ORAL_TABLET | Freq: Four times a day (QID) | ORAL | Status: DC
  Administered 2019-03-09: 0.1 mg via ORAL
  Filled 2019-03-09: qty 1

## 2019-03-09 MED ORDER — BUSPIRONE HCL 5 MG PO TABS: 10.0000 mg | ORAL_TABLET | Freq: Two times a day (BID) | ORAL | Status: DC

## 2019-03-09 MED ORDER — METHOCARBAMOL 500 MG PO TABS: 500.0000 mg | ORAL_TABLET | Freq: Three times a day (TID) | ORAL | Status: DC | PRN

## 2019-03-09 MED ORDER — HYDROXYZINE HCL 25 MG PO TABS: 25.0000 mg | ORAL_TABLET | Freq: Four times a day (QID) | ORAL | 0 refills | Status: AC | PRN

## 2019-03-09 MED ORDER — MAGNESIUM HYDROXIDE 400 MG/5ML PO SUSP: 30.0000 mL | Freq: Every day | ORAL | Status: DC | PRN

## 2019-03-09 NOTE — Patient Outreach (Signed)
[facility] met with the patient in order to provide substance use recovery support and help with getting connected to substance use recovery resources. Patient reports a history of daily IV heroin use. Patient reports that he is not ready to quit his active substance use at this time. [facility] talked to the patient about harm reduction option with [facility] and talked to the patient about their free harm reduction services. Patient is interested in getting connected with [facility]'s harm reduction services. [facility] provided a flier that described their syringe exchange program services that provides clean syringes, narcan, fentanyl test strips, etc. to help reduce the risk of an opioid related overdose. [facility] also provided the patient with information for several other substance use recovery resources. Some of these resources include residential/outpatient substance use treatment center list, detox center list, [facility] NA meeting list, flier for [employer] of the [place] outpatient dual diagnosis treatment services, [place] vacancy list/flier detailing [employer], and [facility] contact information. [facility] strongly encouraged the patient to contact [facility] if needed for further help with [facility] substance use recovery outreach services.

## 2019-03-09 NOTE — [facility] Suicide Risk Assessment (Cosign Needed)
Suicide Risk Assessment  Discharge Assessment   [facility] Discharge Suicide Risk Assessment   Principal Problem: Substance induced mood disorder ([place]) Discharge Diagnoses: Principal Problem:   Substance induced mood disorder ([place]) Active Problems:   Opioid use disorder (HCC)   Opioid use disorder, severe, dependence ([place])   Adjustment disorder with mixed anxiety and depressed mood   Total Time spent with patient: 15 minutes   Evaluation: Patient was seen and evaluated by attending psychiatrist Akintayo and nurse practitioners.  Denying suicidal or homicidal ideations.  Denies auditory or visual hallucinations.  Patient reports substance abuse addiction related to heroin. "  I am not interested in stop using at this time."  Stated longest period of sobriety has been 2 months prior.  States he has been using for the past 2 years.  Patient reports he has been able to afford medications to help him stay sober citing he was prescribed BuSpar and Zoloft in the past.  Discussed available resources throughout the community for discounted/free medications.  Patient to follow-up with [facility].  Orders placed for peers support follow-up.  NP requested follow-up with parents. patient declined at this time.  Treatment team discussed at length consequences of substance abuse use up to and including death.  Patient was response active, however declined citing " I am just not ready to stop using at this time."   We will continue to monitor for safety.  Support encouragement reassurance was provided.  HPI: Per admission assessment note-Edward White an 28 y.o.male, who presents involuntary and unaccompanied to [facility].Clinician asked the pt, "what brought you to the hospital?"Pt reported, his parents thinks he's going to kill himself by overdosing. Clinician observed red track marks on both of pt's wrists. Pt reported, symptoms of depression and anxiety. Pt reported, he has a knife at home but does not use it to  harm himself. Pt reported, he last curt himself and attempted suicide was at the age of 37. Pt denies, SI, HI, AVH, self-injurious behaviors.   Musculoskeletal: Strength & Muscle Tone: within normal limits Gait & Station: normal Patient leans: N/A  Psychiatric Specialty Exam:   Blood pressure 119/67, pulse 98, temperature 98.9 F (37.2 C), resp. rate 16, height 5' 9.69" (1.77 m), weight 72.1 kg, SpO2 100 %.Body mass index is 23.02 kg/m.  General Appearance: Casual  Eye Contact::  Fair  Speech:  Clear and Coherent  Volume:  Normal  Mood:  Anxious and Depressed  Affect:  Congruent  Thought Process:  Coherent  Orientation:  Full (Time, Place, and Person)  Thought Content:  Logical  Suicidal Thoughts:  No  Homicidal Thoughts:  No  Memory:  Immediate;   Fair Recent;   Fair  Judgement:  Fair  Insight:  Fair  Psychomotor Activity:  Normal  Concentration:  Fair  Recall:  [employer] of Knowledge:Fair  Language: Fair  Akathisia:  No  Handed:  Right  AIMS (if indicated):     Assets:  Communication Skills Desire for Improvement Resilience Social Support  Sleep:     Cognition: WNL  ADL's:  Intact     Mental Status Per Nursing Assessment::   On Admission:  NA  Demographic Factors:  Male and Unemployed  Loss Factors: Decrease in vocational status and Financial problems/change in socioeconomic status  Historical Factors: Impulsivity and subsance abuse   Risk Reduction Factors:   Living with another person, especially a relative and Positive therapeutic relationship  Continued Clinical Symptoms:  Depression:   Comorbid alcohol abuse/dependence reported  Opioid and Xanax   Cognitive Features That Contribute To Risk:  Closed-mindedness    Suicide Risk:  Minimal: No identifiable suicidal ideation.  Patients presenting with no risk factors but with morbid ruminations; may be classified as minimal risk based on the severity of the depressive symptoms    Plan Of  Care/Follow-up recommendations:  Activity:  as tolerated Diet:  heart healthy CSW to provide additional outpatient resources Orders placed for peers support   Oneta Rack, NP 03/09/2019, 10:44 AM

## 2019-03-09 NOTE — Progress Notes (Signed)
[facility] NOVEL CORONAVIRUS (COVID-19) DAILY CHECK-OFF SYMPTOMS - answer yes or no to each - every day NO YES  Have you had a fever in the past 24 hours?  . Fever (Temp > 37.80C / 100F) X   Have you had any of these symptoms in the past 24 hours? . New Cough .  Sore Throat  .  Shortness of Breath .  Difficulty Breathing .  Unexplained Body Aches   X   Have you had any one of these symptoms in the past 24 hours not related to allergies?   . Runny Nose .  Nasal Congestion .  Sneezing   X   If you have had runny nose, nasal congestion, sneezing in the past 24 hours, has it worsened?  X   EXPOSURES - check yes or no X   Have you traveled outside the state in the past 14 days?  X   Have you been in contact with someone with a confirmed diagnosis of COVID-19 or PUI in the past 14 days without wearing appropriate PPE?  X   Have you been living in the same home as a person with confirmed diagnosis of COVID-19 or a PUI (household contact)?    X   Have you been diagnosed with COVID-19?    X              What to do next: Answered NO to all: Answered YES to anything:   Proceed with unit schedule Follow the [facility] Inpatient Flowsheet.   

## 2019-03-09 NOTE — H&P (Signed)
BH Observation Unit Provider Admission PAA/H&P  Patient Identification: Edward White MRN:  [id number] Date of Evaluation:  03/09/2019 Chief Complaint:  Substance induced mood disorder (HCC) [F19.94] Principal Diagnosis: Substance induced mood disorder (HCC) Diagnosis:  Principal Problem:   Substance induced mood disorder (HCC) Active Problems:   Opioid use disorder (HCC)  History of Present Illness:  TTS Assessment:  Edward White is an 29 y.o. male, who presents involuntary and unaccompanied to [facility]. Clinician asked the pt, "what brought you to the hospital?" Pt reported, his parents thinks he's going to kill himself by overdosing. Clinician observed red track marks on both of pt's wrists. Pt reported, symptoms of depression and anxiety. Pt reported, he has a knife at home but does not use it to harm himself. Pt reported, he last curt himself and attempted suicide was at the age of [age]. Pt denies, SI, HI, AVH, self-injurious behaviors.  Pt was IVC'd by stepfather Daniel Nones, [phone]). Per IVC paperwork: "I left my stepson passed out in his room. He does opioids, marijuana and Xanax everyday. I have involuntarily committed him before; the last time was 3 months ago. The doctors at the facility gave him prescriptions that he does not take on a regular basis. He does not eat regularly, he does not tent to his hygiene and he does not sleep on a regular basis either. Whenever we try to set him straight and talk to him his answer is "Ill kill myself." We are at out wits end and do not know of another way to help him. The lat time he was violent with [place] was about 5 months ago, but her passes out from drugs almost every night. He currently has 10 needles in his room with powder, spoons and pills around. We need help or he may kill himself by overdosing."   Pt reported, using .1 mg (intervenously) of Heroin at 2pm. Pt reported, smoking marijuana everyday. Pt reported, using Xanax,  once every two weeks. Pt's UDS is pending. Pt denies, being linked to OPT resources (medication management and/or counseling.) Pt reported, previous inpatient admissions to [facility] and [facility] a couple months ago.   Evaluation on Unit: Reviewed TTS assessment and validated with patient. On evaluation patient is alert and oriented x 4, pleasant, and cooperative. Speech is clear and coherent. Mood is depressed and affect is congruent with mood. Thought process is coherent and thoughDenies audiovisual hallucinations. No indication that patient is responding to internal stimuli. Reports daily use of heroin. Patient has multiple track marks on bilateral arms. Uses marijuana on a regular basis. States that he has used xanax in the past but states that he has not used in a couple of months. Patient was prescribed gabapentin, zoloft, and trazodone during last admission but states that he did not continue these after discharge. He is interested in substance abuse treatment.   Associated Signs/Symptoms: Depression Symptoms:  depressed mood, insomnia, anxiety, decreased appetite, (Hypo) Manic Symptoms:  Impulsivity, Anxiety Symptoms:  Excessive Worry, Psychotic Symptoms:  Denies, none noted PTSD Symptoms: NA Total Time spent with patient: 30 minutes  Past Psychiatric History: Last inpatient admission at [facility] 10/2018. Inpatient at [facility] 04/2018.  Reports substance abuse treatment at [facility] about 4 months ago. History of suicide attempts in the past, most recently 4 years ago by overdosing. Reports long history of depression and anxiety, which he describes as chronic, and going back to adolescence.   Is the patient at risk to self? Yes.  Has the patient been a risk to self in the past 6 months? Yes.    Has the patient been a risk to self within the distant past? Yes.    Is the patient a risk to others? No.  Has the patient been a risk to others in the past 6 months? No.  Has the patient been a risk to others  within the distant past? No.   Prior Inpatient Therapy:   Prior Outpatient Therapy:    Alcohol Screening:   Substance Abuse History in the last 12 months:  Yes.   Consequences of Substance Abuse: Medical Consequences:  Abcess related to IV drug use Family Consequences:  Family Discord Withdrawal Symptoms:   Headaches Nausea Tremors Previous Psychotropic Medications: Yes  Psychological Evaluations: No  Past Medical History:  Past Medical History:  Diagnosis Date  . Anxiety   . Bipolar 1 disorder ([place])   . Depression    No past surgical history on file. Family History: No family history on file. father died from cardiac illness when patient was 46. Mother alive. Has one brother Family Psychiatric History: father had history of alcohol dependence, paternal aunt committed suicide . Tobacco Screening:   Social History:  Social History   Substance and Sexual Activity  Alcohol Use No     Social History   Substance and Sexual Activity  Drug Use Yes  . Frequency: 5.0 times per week  . Types: Marijuana    Additional Social History:                           Allergies:   Allergies  Allergen Reactions  . Haloperidol Other (See Comments)    EPS symptoms of jaw stiffness   Lab Results: No results found for this or any previous visit (from the past 48 hour(s)).  Blood Alcohol level:  Lab Results  Component Value Date   ETH <10 40/98/1191    Metabolic Disorder Labs:  Lab Results  Component Value Date   HGBA1C 5.4 10/24/2018   MPG 108.28 10/24/2018   No results found for: PROLACTIN Lab Results  Component Value Date   CHOL 114 10/24/2018   TRIG 69 10/24/2018   HDL 55 10/24/2018   CHOLHDL 2.1 10/24/2018   VLDL 14 10/24/2018   LDLCALC 45 10/24/2018    Current Medications: Current Facility-Administered Medications  Medication Dose Route Frequency Provider Last Rate Last Admin  . alum & mag hydroxide-simeth (MAALOX/MYLANTA) 200-200-20 MG/5ML suspension  30 mL  30 mL Oral Q4H PRN Lindon Romp A, NP      . cloNIDine (CATAPRES) tablet 0.1 mg  0.1 mg Oral QID Rozetta Nunnery, NP       Followed by  . [START ON 03/11/2019] cloNIDine (CATAPRES) tablet 0.1 mg  0.1 mg Oral BH-qamhs Rozetta Nunnery, NP       Followed by  . [START ON 03/14/2019] cloNIDine (CATAPRES) tablet 0.1 mg  0.1 mg Oral QAC breakfast Lindon Romp A, NP      . dicyclomine (BENTYL) tablet 20 mg  20 mg Oral Q6H PRN Lindon Romp A, NP      . gabapentin (NEURONTIN) capsule 200 mg  200 mg Oral TID Lindon Romp A, NP      . hydrOXYzine (ATARAX/VISTARIL) tablet 25 mg  25 mg Oral Q6H PRN Lindon Romp A, NP      . loperamide (IMODIUM) capsule 2-4 mg  2-4 mg Oral PRN Rozetta Nunnery, NP      .  magnesium hydroxide (MILK OF MAGNESIA) suspension 30 mL  30 mL Oral Daily PRN Nira Conn A, NP      . methocarbamol (ROBAXIN) tablet 500 mg  500 mg Oral Q8H PRN Nira Conn A, NP      . naproxen (NAPROSYN) tablet 500 mg  500 mg Oral BID PRN Nira Conn A, NP      . nicotine (NICODERM CQ - dosed in mg/24 hours) patch 21 mg  21 mg Transdermal Daily Nira Conn A, NP      . ondansetron (ZOFRAN-ODT) disintegrating tablet 4 mg  4 mg Oral Q6H PRN Nira Conn A, NP      . traZODone (DESYREL) tablet 150 mg  150 mg Oral QHS Nira Conn A, NP       PTA Medications: Medications Prior to Admission  Medication Sig Dispense Refill Last Dose  . acetaminophen (TYLENOL) 325 MG tablet Take 650 mg by mouth every 6 (six) hours as needed for mild pain or headache.     . gabapentin (NEURONTIN) 100 MG capsule Take 2 capsules (200 mg total) by mouth 3 (three) times daily. (Patient not taking: Reported on 02/10/2019) 180 capsule 0   . ibuprofen (ADVIL) 200 MG tablet Take 400 mg by mouth every 6 (six) hours as needed for fever, headache or moderate pain.     . nicotine (NICODERM CQ - DOSED IN MG/24 HOURS) 21 mg/24hr patch Place 1 patch (21 mg total) onto the skin daily. (Patient not taking: Reported on 02/10/2019) 28 patch 0    . ondansetron (ZOFRAN ODT) 4 MG disintegrating tablet Take 1 tablet (4 mg total) by mouth every 8 (eight) hours as needed. 10 tablet 0   . sertraline (ZOLOFT) 50 MG tablet Take 1 tablet (50 mg total) by mouth daily. (Patient not taking: Reported on 02/10/2019) 30 tablet 0   . traZODone (DESYREL) 150 MG tablet Take 1 tablet (150 mg total) by mouth at bedtime. (Patient not taking: Reported on 02/10/2019) 30 tablet 0     Musculoskeletal: Strength & Muscle Tone: within normal limits Gait & Station: normal Patient leans: N/A  Psychiatric Specialty Exam: Physical Exam  Constitutional: He is oriented to person, place, and time. He appears well-developed and well-nourished. No distress.  HENT:  Head: Normocephalic and atraumatic.  Right Ear: External ear normal.  Left Ear: External ear normal.  Eyes: Pupils are equal, round, and reactive to light. Right eye exhibits no discharge. Left eye exhibits no discharge.  Respiratory: Effort normal. No respiratory distress.  Musculoskeletal:        General: Normal range of motion.  Neurological: He is alert and oriented to person, place, and time.  Skin: He is not diaphoretic.       Review of Systems  Constitutional: Negative for activity change, appetite change, chills, diaphoresis, fatigue, fever and unexpected weight change.  HENT: Negative.   Eyes: Negative.   Respiratory: Negative for cough and shortness of breath.   Cardiovascular: Negative for chest pain.  Gastrointestinal: Negative for diarrhea, nausea and vomiting.  Musculoskeletal: Negative.   Neurological: Negative.   Psychiatric/Behavioral: Positive for dysphoric mood and sleep disturbance. Negative for hallucinations and suicidal ideas. The patient is nervous/anxious.     Blood pressure 131/70, pulse (!) 112, temperature 98.1 F (36.7 C), temperature source Oral, resp. rate 16, SpO2 100 %.There is no height or weight on file to calculate BMI.  General Appearance: Disheveled  Eye  Contact:  Fair  Speech:  Clear and Coherent and Normal Rate  Volume:  Normal  Mood:  Anxious and Depressed  Affect:  Congruent and Depressed  Thought Process:  Coherent, Goal Directed, Linear and Descriptions of Associations: Intact  Orientation:  Full (Time, Place, and Person)  Thought Content:  Hallucinations: None  Suicidal Thoughts:  No  Homicidal Thoughts:  No  Memory:  Immediate;   Good Recent;   Good  Judgement:  Impaired  Insight:  Lacking  Psychomotor Activity:  Normal  Concentration:  Concentration: Fair  Recall:  Good  Fund of Knowledge:  Good  Language:  Good  Akathisia:  Negative  Handed:  Right  AIMS (if indicated):     Assets:  Communication Skills Desire for Improvement Housing Leisure Time Physical Health  ADL's:  Intact  Cognition:  WNL  Sleep:         Treatment Plan Summary: Daily contact with patient to assess and evaluate symptoms and progress in treatment and Medication management  Observation Level/Precautions:  Detox 15 minute checks Laboratory:  CBC Chemistry Profile UDS Psychotherapy:  Individual Medications:   Clonidine opiate withdraw protocol Gabapentin 200 mg TID for substance abuse withdraw/anxiety Consultations:  peer support Discharge Concerns:  Continued substance abuse Estimated LOS: Other:      Jackelyn Poling, NP 1/16/20212:52 AM

## 2019-03-09 NOTE — BH Assessment (Signed)
Assessment Note  Edward White is an 29 y.o. male, who presents involuntary and unaccompanied to [facility]. Clinician asked the pt, "what brought you to the hospital?" Pt reported, his parents thinks he's going to kill himself by overdosing. Clinician observed red track marks on both of pt's wrists. Pt reported, symptoms of depression and anxiety. Pt reported, he has a knife at home but does not use it to harm himself. Pt reported, he last curt himself and attempted suicide was at the age of [age]. Pt denies, SI, HI, AVH, self-injurious behaviors.  Pt was IVC'd by stepfather Edward White, [phone]). Per IVC paperwork: "I left my stepson passed out in his room. He does opioids, marijuana and Xanax everyday. I have involuntarily committed him before; the last time was 3 months ago. The doctors at the facility gave him prescriptions that he does not take on a regular basis. He does not eat regularly, he does not tent to his hygiene and he does not sleep on a regular basis either. Whenever we try to set him straight and talk to him his answer is "Ill kill myself." We are at out wits end and do not know of another way to help him. The lat time he was violent with [place] was about 5 months ago, but her passes out from drugs almost every night. He currently has 10 needles in his room with powder, spoons and pills around. We need help or he may kill himself by overdosing."   Pt reported, using .1 mg (intervenously) of Heroin at 2pm. Pt reported, smoking marijuana everyday. Pt reported, using Xanax, once every two weeks. Pt's UDS is pending. Pt denies, being linked to OPT resources (medication management and/or counseling.) Pt reported, previous inpatient admissions to [facility] and [facility] a couple months ago.   Pt presents, disheveled, quiet, and awake with logical, coherent speech. Pt's mood. affect was depressed. Pt's thought process was coherent, relevant. Pt's judgement was partial. Pt was oriented x4.  Pt's concentration was normal. Pt's insight was fair. Pt's impulse control was poor.  Diagnosis: MDD (major depressive disorder), recurrent episode, severe (HCC)                   Opioid use Disorder, severe.   Past Medical History:  Past Medical History:  Diagnosis Date  . Anxiety   . Bipolar 1 disorder (HCC)   . Depression     No past surgical history on file.  Family History: No family history on file.  Social History:  reports that he has been smoking cigarettes. He has a 0.50 pack-year smoking history. He has never used smokeless tobacco. He reports current drug use. Frequency: 5.00 times per week. Drug: Marijuana. He reports that he does not drink alcohol.  Additional Social History:  Alcohol / Drug Use Pain Medications: See MAR Prescriptions: See MAR Over the Counter: See MAR History of alcohol / drug use?: Yes Longest period of sobriety (when/how long): Per pt, "a couple of months." Substance #1 Name of Substance 1: Heroin. 1 - Age of First Use: 23 or 24. 1 - Amount (size/oz): Pt reported, using .1 mg at 2pm. 1 - Frequency: Daily. 1 - Duration: Ongoing. 1 - Last Use / Amount: 2pm (03/08/2019) Substance #2 Name of Substance 2: Marijuana. 2 - Age of First Use: UTA 2 - Amount (size/oz): UTA 2 - Frequency: Daily. 2 - Duration: Ongoing. 2 - Last Use / Amount: Daily. Substance #3 Name of Substance 3: Xanax. 3 -  Age of First Use: UTA 3 - Amount (size/oz): UTA 3 - Frequency: Pt reported, once every two weeks. 3 - Duration: Ongoing. 3 - Last Use / Amount: UTA  CIWA: CIWA-Ar BP: 131/70 Pulse Rate: (!) 112 COWS:    Allergies:  Allergies  Allergen Reactions  . Haloperidol Other (See Comments)    EPS symptoms of jaw stiffness    Home Medications:  Medications Prior to Admission  Medication Sig Dispense Refill  . acetaminophen (TYLENOL) 325 MG tablet Take 650 mg by mouth every 6 (six) hours as needed for mild pain or headache.    . gabapentin (NEURONTIN) 100  MG capsule Take 2 capsules (200 mg total) by mouth 3 (three) times daily. (Patient not taking: Reported on 02/10/2019) 180 capsule 0  . ibuprofen (ADVIL) 200 MG tablet Take 400 mg by mouth every 6 (six) hours as needed for fever, headache or moderate pain.    . nicotine (NICODERM CQ - DOSED IN MG/24 HOURS) 21 mg/24hr patch Place 1 patch (21 mg total) onto the skin daily. (Patient not taking: Reported on 02/10/2019) 28 patch 0  . ondansetron (ZOFRAN ODT) 4 MG disintegrating tablet Take 1 tablet (4 mg total) by mouth every 8 (eight) hours as needed. 10 tablet 0  . sertraline (ZOLOFT) 50 MG tablet Take 1 tablet (50 mg total) by mouth daily. (Patient not taking: Reported on 02/10/2019) 30 tablet 0  . traZODone (DESYREL) 150 MG tablet Take 1 tablet (150 mg total) by mouth at bedtime. (Patient not taking: Reported on 02/10/2019) 30 tablet 0    OB/GYN Status:  No LMP for male patient.  General Assessment Data Location of Assessment: [facility] Assessment Services TTS Assessment: In system Is this a Tele or Face-to-Face Assessment?: Face-to-Face Is this an Initial Assessment or a Re-assessment for this encounter?: Initial Assessment Patient Accompanied by:: N/A Language Other than English: No Living Arrangements: Other (Comment)(Parents. ) What gender do you identify as?: Male Marital status: Single Living Arrangements: Parent Can pt return to current living arrangement?: Yes Admission Status: Involuntary Petitioner: Family member Is patient capable of signing voluntary admission?: No Referral Source: Self/Family/Friend Insurance type: Self-pay.   Medical Screening Exam Assurance [facility] Walk[facility]) Medical Exam completed: Yes  Crisis Care Plan Living Arrangements: Parent Legal Guardian: Other:(Self. ) Name of Psychiatrist: NA Name of Therapist: NA  Education Status Is patient currently in school?: No Is the patient employed, unemployed or receiving disability?: Unemployed  Risk to self with the past  6 months Suicidal Ideation: No(Pt denies. ) Has patient been a risk to self within the past 6 months prior to admission? : No Suicidal Intent: No Has patient had any suicidal intent within the past 6 months prior to admission? : No Is patient at risk for suicide?: No Suicidal Plan?: No Has patient had any suicidal plan within the past 6 months prior to admission? : No Access to Means: No What has been your use of drugs/alcohol within the last 12 months?: UDS is pending. Previous Attempts/Gestures: Yes How many times?: 1 Other Self Harm Risks: Drug use. Triggers for Past Attempts: Unknown Intentional Self Injurious Behavior: None(Pt reported, he last cut when he was 85 yerds old. ) Family Suicide History: No Recent stressful life event(s): Other (Comment)(Car was repossessed.) Persecutory voices/beliefs?: No Depression: Yes Depression Symptoms: Feeling worthless/self pity, Loss of interest in usual pleasures, Isolating, Insomnia, Despondent Substance abuse history and/or treatment for substance abuse?: Yes Suicide prevention information given to non-admitted patients: Not applicable  Risk to Others  within the past 6 months Homicidal Ideation: No(Pt denies. ) Does patient have any lifetime risk of violence toward others beyond the six months prior to admission? : No Thoughts of Harm to Others: No Current Homicidal Intent: No Current Homicidal Plan: No Access to Homicidal Means: No Identified Victim: NA History of harm to others?: No(Pt denies. ) Assessment of Violence: None Noted Violent Behavior Description: NA Does patient have access to weapons?: No Criminal Charges Pending?: Yes Describe Pending Criminal Charges: Possession charge (Xanax). Does patient have a court date: Yes Court Date: (Pt reported, in a couple of days. ) Is patient on probation?: No  Psychosis Hallucinations: None noted Delusions: None noted  Mental Status Report Appearance/Hygiene: Disheveled Eye  Contact: Fair Motor Activity: Unremarkable Speech: Logical/coherent Level of Consciousness: Quiet/awake Mood: Depressed Affect: Flat Anxiety Level: Moderate Thought Processes: Coherent, Relevant Judgement: Partial Orientation: Person, Place, Time, Situation Obsessive Compulsive Thoughts/Behaviors: None  Cognitive Functioning Concentration: Normal Memory: Recent Intact Is patient IDD: No Insight: Fair Impulse Control: Poor Appetite: Good Sleep: Decreased Total Hours of Sleep: 4 Vegetative Symptoms: Staying in bed  ADLScreening [facility] Assessment Services) Patient's cognitive ability adequate to safely complete daily activities?: Yes Patient able to express need for assistance with ADLs?: Yes Independently performs ADLs?: Yes (appropriate for developmental age)  Prior Inpatient Therapy Prior Inpatient Therapy: Yes Prior Therapy Dates: 2020. Prior Therapy Facilty/Provider(s): [facility], [facility]. Reason for Treatment: Substance use.   Prior Outpatient Therapy Prior Outpatient Therapy: No Does patient have an ACCT team?: No Does patient have Intensive In-House Services?  : No Does patient have [facility] services? : No Does patient have P4CC services?: No  ADL Screening (condition at time of admission) Patient's cognitive ability adequate to safely complete daily activities?: Yes Is the patient deaf or have difficulty hearing?: No Does the patient have difficulty seeing, even when wearing glasses/contacts?: No Does the patient have difficulty concentrating, remembering, or making decisions?: No Patient able to express need for assistance with ADLs?: Yes Does the patient have difficulty dressing or bathing?: No Independently performs ADLs?: Yes (appropriate for developmental age) Does the patient have difficulty walking or climbing stairs?: No Weakness of Legs: None Weakness of Arms/Hands: Both  Home Assistive Devices/Equipment Home Assistive Devices/Equipment: Contact  lenses    Abuse/Neglect Assessment (Assessment to be complete while patient is alone) Abuse/Neglect Assessment Can Be Completed: Yes Physical Abuse: Denies Verbal Abuse: Yes, past (Comment) Sexual Abuse: Denies Exploitation of patient/patient's resources: Denies Self-Neglect: Denies     Regulatory affairs officer (For Healthcare) Does Patient Have a Medical Advance Directive?: No          Disposition: Lindon Romp, NP recommends pt to be admitted to OBS Unit, pending negative COVID test.     On Site Evaluation by: Vertell Novak, MS, [facility], [facility]. Reviewed with Physician: Lindon Romp, NP.   Vertell Novak 03/09/2019 3:12 AM    Vertell Novak, MS, [facility], [place] Triage Specialist [phone]

## 2019-03-09 NOTE — Plan of Care (Signed)
[facility] Observation Crisis Plan  Reason for Crisis Plan:  Crisis Stabilization   Plan of Care:  Referral for Inpatient Hospitalization  Family Support:      Current Living Environment:  Living Arrangements: Parent  Insurance:   Hospital Account    Name Acct ID Class Status Primary Coverage   Edward White, Edward White [id number] BEHAVIORAL HEALTH OBSERVATION Open None        Guarantor Account (for Hospital Account [id number])    Name Relation to Pt Service Area Active? Acct Type   Edward White Self [facility] Yes Behavioral Health   Address Phone       [address] [place], [place] [address] [phone](H)          Coverage Information (for Hospital Account [id number])    Not on file      Legal Guardian:  Legal Guardian: Other:(Self. )  Primary Care Provider:  Patient, No Pcp Per  Current Outpatient Providers:  NA  Psychiatrist:  Name of Psychiatrist: NA  Counselor/Therapist:  Name of Therapist: NA  Compliant with Medications:  No  Additional Information:   Edward White 1/16/20213:56 AM

## 2019-03-09 NOTE — B[facility] Counselor (Signed)
Clinician attempted to call IVC petitoner/pt's stepfather Daniel Nones, [phone]) however call continued to ring then to voicemail. Clinician was unable to leave a voice messages mailbox was full.     Redmond Pulling, MS, [facility], [facility] Triage Specialist [phone]

## 2019-03-09 NOTE — Progress Notes (Signed)
Patient ID: Edward White, male   DOB: March 23, 1990, 29 y.o.   MRN: [id number] Pt A&O x 4, presents with IVC by the father for depression and anxiety, possibly overdosing on Heroin.  Multiple track marks noted to hands and arms.  Father reports pt passes out from drug use almost every night. Pt calm & cooperative at present, Skin search completed, monitoring for safety, Pending results of COVID.

## 2019-03-09 NOTE — Progress Notes (Signed)
D: Pt A & O X3. Presents with flat affect, fair eye contact, logical speech and observed to be anxious but pleasant on interactions. Pt appeared preoccupied about d/c "Can I leave now, I'm ready to leave, can you". Denies SI, HI, AVH and pain at this time. D/C home as ordered. Escorted to lobby by Clinical research associate at time of d/c.  A: D/C instructions reviewed with pt including prescriptions and peer support resources for substance abuse; compliance encouraged. All belongings from locker 30 returned to pt at time of departure. Scheduled  medication administered as ordered with verbal education and effects monitored. Safety checks maintained without incident till time of d/c.  R: Pt receptive to care. Compliant with medications when offered this morning. Denies adverse drug reactions when assessed. Verbalized understanding related to d/c instructions. Signed belonging sheet in agreement with items received from locker. Ambulatory with a steady gait. Appears to be in no physical distress at time of departure.

## 2019-03-09 NOTE — Progress Notes (Signed)
Patient ID: Edward White, male   DOB: Aug 20, 1990, 30 y.o.   MRN: [id number] Pt brought over from [address] hall. Pt A & O x 4. Pt calm and cooperative. Pt denies any needs at the moment. Will continue to monitor for safety and stability.

## 2019-03-09 NOTE — Discharge Summary (Addendum)
Physician Discharge Summary Note  Patient:  Edward White is an 29 y.o., male MRN:  [id number] DOB:  1991-01-25 Patient phone:  [phone] (home)  Patient address:   [place] [address],  Total Time spent with patient: 15 minutes  Date of Admission:  03/09/2019 Date of Discharge: 03/09/2019    Reason for Admission:  Evaluation: Patient was seen and evaluated by attending psychiatrist Alee Gressman and nurse practitioners.  Denying suicidal or homicidal ideations.  Denies auditory or visual hallucinations.  Patient reports substance abuse addiction related to heroin. "  I am not interested in stop using at this time."  Stated longest period of sobriety has been 2 months prior.  States he has been using for the past 2 years.  Patient reports he has been able to afford medications to help him stay sober citing he was prescribed BuSpar and Zoloft in the past.  Discussed available resources throughout the community for discounted/free medications.  Patient to follow-up with [facility].  Orders placed for peers support follow-up.  NP requested follow-up with parents. patient declined at this time.  Treatment team discussed at length consequences of substance abuse use up to and including death.  Patient was response active, however declined citing " I am just not ready to stop using at this time."   We will continue to monitor for safety.  Support encouragement reassurance was provided.   HPI: Per admission assessment note-Edward White is an 29 y.o. male, who presents involuntary and unaccompanied to [facility]. Clinician asked the pt, "what brought you to the hospital?" Pt reported, his parents thinks he's going to kill himself by overdosing. Clinician observed red track marks on both of pt's wrists. Pt reported, symptoms of depression and anxiety. Pt reported, he has a knife at home but does not use it to harm himself. Pt reported, he last curt himself and attempted suicide was at the age of  36. Pt denies, SI, HI, AVH, self-injurious behaviors.    Principal Problem: Substance induced mood disorder ([place]) Discharge Diagnoses: Principal Problem:   Substance induced mood disorder ([place]) Active Problems:   Opioid use disorder (HCC)   Opioid use disorder, severe, dependence ([place])   Adjustment disorder with mixed anxiety and depressed mood   Past Psychiatric History:   Past Medical History:  Past Medical History:  Diagnosis Date   Anxiety    Bipolar 1 disorder ([place])    Depression    History reviewed. No pertinent surgical history. Family History: History reviewed. No pertinent family history. Family Psychiatric  History:  Social History:  Social History   Substance and Sexual Activity  Alcohol Use No     Social History   Substance and Sexual Activity  Drug Use Yes   Frequency: 5.0 times per week   Types: Marijuana, Heroin    Social History   Socioeconomic History   Marital status: Single    Spouse name: Not on file   Number of children: Not on file   Years of education: Not on file   Highest education level: Not on file  Occupational History   Not on file  Tobacco Use   Smoking status: Current Every Day Smoker    Packs/day: 0.50    Years: 1.00    Pack years: 0.50    Types: Cigarettes   Smokeless tobacco: Never Used  Substance and Sexual Activity   Alcohol use: No   Drug use: Yes    Frequency: 5.0 times per week    Types:  Marijuana, Heroin   Sexual activity: Not on file    Comment: pt smokes to help him go to sleep   Other Topics Concern   Not on file  Social History Narrative   Not on file   Social Determinants of Health   Financial Resource Strain:    Difficulty of Paying Living Expenses: Not on file  Food Insecurity:    Worried About Running Out of Food in the Last Year: Not on file   Ran Out of Food in the Last Year: Not on file  Transportation Needs:    Lack of Transportation (Medical): Not on file   Lack of Transportation  (Non-Medical): Not on file  Physical Activity:    Days of Exercise per Week: Not on file   Minutes of Exercise per Session: Not on file  Stress:    Feeling of Stress : Not on file  Social Connections:    Frequency of Communication with Friends and Family: Not on file   Frequency of Social Gatherings with Friends and Family: Not on file   Attends Religious Services: Not on file   Active Member of Clubs or Organizations: Not on file   Attends Banker Meetings: Not on file   Marital Status: Not on file    Hospital Course:  Oather Muilenburg was admitted for Substance induced mood disorder (HCC) and crisis management.  Pt was treated discharged with the medications listed below under Medication List.  Medical problems were identified and treated as needed.  Home medications were restarted as appropriate.  Improvement was monitored by observation and [name] 's daily report of symptom reduction.  Emotional and mental status was monitored by daily self-inventory reports completed by [name] and clinical staff.         Shante Maysonet was evaluated by the treatment team for stability and plans for continued recovery upon discharge. Aryeh Butterfield 's motivation was an integral factor for scheduling further treatment. Employment, transportation, bed availability, health status, family support, and any pending legal issues were also considered during hospital stay. Pt was offered further treatment options upon discharge including but not limited to Residential, Intensive Outpatient, and Outpatient treatment.  Khalid Lacko will follow up with the services as listed below under Follow Up Information.     Upon completion of this admission the patient was both mentally and medically stable for discharge denying suicidal/homicidal ideation, auditory/visual/tactile hallucinations, delusional thoughts and paranoia.    [name] responded well to treatment with Buspar and detox protocol without  adverse effects. Pt demonstrated improvement without reported or observed adverse effects to the point of stability appropriate for outpatient management. Pertinent labs include: UDS - pending results for which outpatient follow-up is necessary for lab recheck as mentioned below. Reviewed CBC, CMP, BAL, and UDS; all unremarkable aside from noted exceptions.   Physical Findings: AIMS: Facial and Oral Movements Muscles of Facial Expression: None, normal Lips and Perioral Area: None, normal Jaw: None, normal Tongue: None, normal,Extremity Movements Upper (arms, wrists, hands, fingers): None, normal Lower (legs, knees, ankles, toes): None, normal, Trunk Movements Neck, shoulders, hips: None, normal, Overall Severity Severity of abnormal movements (highest score from questions above): None, normal Incapacitation due to abnormal movements: None, normal Patient's awareness of abnormal movements (rate only patient's report): No Awareness, Dental Status Current problems with teeth and/or dentures?: No Does patient usually wear dentures?: No  CIWA:  CIWA-Ar Total: 0 COWS:  COWS Total Score: 2  Musculoskeletal: Strength & Muscle Tone: within normal  limits Gait & Station: normal Patient leans: N/A  Psychiatric Specialty Exam: See SRA  Physical Exam  Review of Systems  Blood pressure 119/67, pulse 98, temperature 98.9 F (37.2 C), resp. rate 16, height 5' 9.69" (1.77 m), weight 72.1 kg, SpO2 100 %.Body mass index is 23.02 kg/m.      Has this patient used any form of tobacco in the last 30 days? (Cigarettes, Smokeless Tobacco, Cigars, and/or Pipes)  Yes, A prescription for an FDA-approved tobacco cessation medication was offered at discharge and the patient refused  Blood Alcohol level:  Lab Results  Component Value Date   [facility] <10 10/23/2018    Metabolic Disorder Labs:  Lab Results  Component Value Date   HGBA1C 5.4 10/24/2018   MPG 108.28 10/24/2018   No results found for:  PROLACTIN Lab Results  Component Value Date   CHOL 114 10/24/2018   TRIG 69 10/24/2018   HDL 55 10/24/2018   CHOLHDL 2.1 10/24/2018   VLDL 14 10/24/2018   LDLCALC 45 10/24/2018    See Psychiatric Specialty Exam and Suicide Risk Assessment completed by Attending Physician prior to discharge.  Discharge destination:  Home  Is patient on multiple antipsychotic therapies at discharge:  No   Has Patient had three or more failed trials of antipsychotic monotherapy by history:  No  Recommended Plan for Multiple Antipsychotic Therapies: NA  Discharge Instructions     Diet - low sodium heart healthy   Complete by: As directed    Increase activity slowly   Complete by: As directed       Allergies as of 03/09/2019       Reactions   Haloperidol Other (See Comments)   EPS symptoms of jaw stiffness        Medication List     STOP taking these medications    acetaminophen 325 MG tablet Commonly known as: TYLENOL   gabapentin 100 MG capsule Commonly known as: NEURONTIN   ibuprofen 200 MG tablet Commonly known as: ADVIL   nicotine 21 mg/24hr patch Commonly known as: NICODERM CQ - dosed in mg/24 hours   ondansetron 4 MG disintegrating tablet Commonly known as: Zofran ODT   sertraline 50 MG tablet Commonly known as: ZOLOFT   traZODone 150 MG tablet Commonly known as: DESYREL       TAKE these medications      Indication  busPIRone 10 MG tablet Commonly known as: BUSPAR Take 1 tablet (10 mg total) by mouth 2 (two) times daily.  Indication: Anxiety Disorder, Major Depressive Disorder   hydrOXYzine 25 MG tablet Commonly known as: ATARAX/VISTARIL Take 1 tablet (25 mg total) by mouth every 6 (six) hours as needed for anxiety.  Indication: Feeling Anxious          Follow-up recommendations:  Activity:  as tolerated Diet:  heart healthy  CSW to provide additional outpatient resources Orders placed for peers support   Comments:  Take all medications as  prescribed. Keep all follow-up appointments as scheduled.  Do not consume alcohol or use illegal drugs while on prescription medications. Report any adverse effects from your medications to your primary care provider promptly.  In the event of recurrent symptoms or worsening symptoms, call 911, a crisis hotline, or go to the nearest emergency department for evaluation.   Signed: Oneta Rack, NP 03/09/2019, 10:59 AM Patient seen face-to-face for psychiatric evaluation, chart reviewed and case discussed with the physician extender and developed treatment plan. Reviewed the information documented and agree with the  treatment plan. Thedore Mins, MD

## 2019-03-27 MED ORDER — NICOTINE 14 MG/24HR TD PT24
1.00 | MEDICATED_PATCH | TRANSDERMAL | Status: DC
Start: 2019-03-28 — End: 2019-03-27

## 2019-03-27 MED ORDER — HYDROXYZINE HCL 25 MG PO TABS
50.00 | ORAL_TABLET | ORAL | Status: DC
Start: ? — End: 2019-03-27

## 2019-03-27 MED ORDER — LORAZEPAM 2 MG/ML IJ SOLN
2.00 | INTRAMUSCULAR | Status: DC
Start: ? — End: 2019-03-27

## 2019-03-27 MED ORDER — TRAZODONE HCL 50 MG PO TABS
50.00 | ORAL_TABLET | ORAL | Status: DC
Start: ? — End: 2019-03-27

## 2019-03-27 MED ORDER — GUAIFENESIN 100 MG/5ML PO SYRP
200.00 | ORAL_SOLUTION | ORAL | Status: DC
Start: ? — End: 2019-03-27

## 2019-03-27 MED ORDER — GENERIC EXTERNAL MEDICATION
Status: DC
Start: ? — End: 2019-03-27

## 2019-03-27 MED ORDER — SALINE NASAL SPRAY 0.65 % NA SOLN
1.00 | NASAL | Status: DC
Start: ? — End: 2019-03-27

## 2019-03-27 MED ORDER — CETIRIZINE HCL 10 MG PO TABS
10.00 | ORAL_TABLET | ORAL | Status: DC
Start: ? — End: 2019-03-27

## 2019-03-27 MED ORDER — INFLUENZA VAC SPLIT QUAD 0.5 ML IM SUSY
0.50 | PREFILLED_SYRINGE | INTRAMUSCULAR | Status: DC
Start: ? — End: 2019-03-27

## 2019-03-27 MED ORDER — BENZOCAINE-MENTHOL 6-10 MG MT LOZG
1.00 | LOZENGE | OROMUCOSAL | Status: DC
Start: ? — End: 2019-03-27

## 2019-03-27 MED ORDER — BISACODYL 5 MG PO TBEC
10.00 | DELAYED_RELEASE_TABLET | ORAL | Status: DC
Start: ? — End: 2019-03-27

## 2019-03-27 MED ORDER — CLINDAMYCIN HCL 150 MG PO CAPS
300.00 | ORAL_CAPSULE | ORAL | Status: DC
Start: 2019-03-27 — End: 2019-03-27

## 2019-03-27 MED ORDER — DIPHENHYDRAMINE HCL 25 MG PO CAPS
50.00 | ORAL_CAPSULE | ORAL | Status: DC
Start: ? — End: 2019-03-27

## 2019-03-27 MED ORDER — ALUM & MAG HYDROXIDE-SIMETH 200-200-20 MG/5ML PO SUSP
30.00 | ORAL | Status: DC
Start: ? — End: 2019-03-27

## 2019-03-27 MED ORDER — ONDANSETRON 8 MG PO TBDP
8.00 | ORAL_TABLET | ORAL | Status: DC
Start: ? — End: 2019-03-27

## 2019-03-27 MED ORDER — LOPERAMIDE HCL 2 MG PO CAPS
2.00 | ORAL_CAPSULE | ORAL | Status: DC
Start: ? — End: 2019-03-27

## 2019-03-27 MED ORDER — DIPHENHYDRAMINE HCL 50 MG/ML IJ SOLN
50.00 | INTRAMUSCULAR | Status: DC
Start: ? — End: 2019-03-27

## 2019-03-27 MED ORDER — QUINTABS PO TABS
1.00 | ORAL_TABLET | ORAL | Status: DC
Start: 2019-03-28 — End: 2019-03-27

## 2019-03-27 MED ORDER — ACETAMINOPHEN 325 MG PO TABS
650.00 | ORAL_TABLET | ORAL | Status: DC
Start: ? — End: 2019-03-27

## 2019-03-27 MED ORDER — CLONIDINE HCL 0.1 MG PO TABS
0.10 | ORAL_TABLET | ORAL | Status: DC
Start: ? — End: 2019-03-27

## 2019-03-27 MED ORDER — NICOTINE POLACRILEX 2 MG MT GUM
2.00 | CHEWING_GUM | OROMUCOSAL | Status: DC
Start: ? — End: 2019-03-27

## 2019-05-14 ENCOUNTER — Ambulatory Visit (HOSPITAL_COMMUNITY)
Admission: AD | Admit: 2019-05-14 | Discharge: 2019-05-14 | Disposition: A | Discharge status: Home / Self Care | Payer: Self-pay | Attending: Psychiatry | Admitting: Psychiatry

## 2019-05-14 DIAGNOSIS — F1190 Opioid use, unspecified, uncomplicated: Secondary | ICD-10-CM | POA: Insufficient documentation

## 2019-05-14 DIAGNOSIS — R45851 Suicidal ideations: Secondary | ICD-10-CM | POA: Insufficient documentation

## 2019-05-14 DIAGNOSIS — Z915 Personal history of self-harm: Secondary | ICD-10-CM | POA: Insufficient documentation

## 2019-05-14 NOTE — BH Assessment (Addendum)
Assessment Note  Edward White is an 29 y.o. male who presented to [facility] under IVC completed by step-dad and mom. Pt reports a history of severe struggles with substance use (Heroin, Xanax, THC). Pt reported smoking THC in his room in his parents home and being abruptly approached by PD. Pt reported active drug use using no more than a gram of his drug choice. Pt reported taking 2mg  of xanax this morning to help with heroin cravings, 1mg  at 9am and another at 11am. Pt reported last use of heroin to be 19th or 20th of this month. Pt denied xanax prescription but confirmed to be taking buspar for anxiety on/off for the last few months. Pt reports family history of substance abuse for both parents. Pt disclosed death of dad in 2011-06-19, unknown circumstances.   Collateral contact with step-dad Gershon Mussel, [phone]): Mom Horris Latino) was contacted first but reported to be unable to talk due to driving and provided step-dad's information. Tom reported concerns with Pt active and severe substance use after locating serveral needles in his room after the room was recently cleaned yesterday. Step-dad collaborated information placed on IVC. Step-dad denied pt communicating any current SI threats/attempts but locating threats in Pt text messages a few weeks ago. Step-dad denied Pt returning home due to substance use and expressed the need for detox services.   PT denied any SI/HI or hallucinations. Pt reported his last SI attempt to be when he was 17 years (cutting/ pills). Pt reported to be uninterested in getting cleaned and confirmed his safety.   Diagnosis: Opioid use disorder, severe   Cannabis use disorder, severe  Past Medical History:  Past Medical History:  Diagnosis Date  . Anxiety   . Bipolar 1 disorder ([place])   . Depression     No past surgical history on file.  Family History: No family history on file.  Social History:  reports that he has been smoking cigarettes. He has a 0.50 pack-year smoking  history. He has never used smokeless tobacco. He reports current drug use. Frequency: 5.00 times per week. Drugs: Marijuana and Heroin. He reports that he does not drink alcohol.  Additional Social History:  Alcohol / Drug Use Pain Medications: See MAR Prescriptions: SEE MAR Over the Counter: SEE MAR History of alcohol / drug use?: Yes Substance #1 Name of Substance 1: Heroin 1 - Amount (size/oz): less than gram 1 - Frequency: daily 1 - Last Use / Amount: 19th/20th March Substance #2 Name of Substance 2: HTC 2 - Amount (size/oz): gram 2 - Frequency: Daily 2 - Last Use / Amount: this morning Substance #3 Name of Substance 3: Xanax 3 - Frequency: daily 3 - Last Use / Amount: 2mg  this morning 7am & 11am  CIWA:   COWS:    Allergies:  Allergies  Allergen Reactions  . Haloperidol Other (See Comments)    EPS symptoms of jaw stiffness    Home Medications: (Not in a hospital admission)   OB/GYN Status:  No LMP for male patient.  General Assessment Data Location of Assessment: [facility] Assessment Services TTS Assessment: In system Is this a Tele or Face-to-Face Assessment?: Face-to-Face Is this an Initial Assessment or a Re-assessment for this encounter?: Initial Assessment Patient Accompanied by:: Other(IVC'D) Language Other than English: Yes What is your preferred language: (ENGLISH) Living Arrangements: Other (Comment)(PARENTS HOME ) What gender do you identify as?: Male Marital status: Single Living Arrangements: Parent Can pt return to current living arrangement?: No(PARENTS ARE REFUSING ) Admission Status: Involuntary Petitioner:  Family member(STEP DAD & MOM) Is patient capable of signing voluntary admission?: Yes Referral Source: Other(IVC'D) Insurance type: NONE   Medical Screening Exam [facility] Walk-in ONLY) Medical Exam completed: Yes  Crisis Care Plan Living Arrangements: Parent  Education Status Is patient currently in school?: No Is the patient employed,  unemployed or receiving disability?: Unemployed  Risk to self with the past 6 months Suicidal Ideation: No Has patient been a risk to self within the past 6 months prior to admission? : Other (comment)(step-dad reports seeing text msg weeks ago) Suicidal Intent: No Has patient had any suicidal intent within the past 6 months prior to admission? : No Is patient at risk for suicide?: No Suicidal Plan?: No Has patient had any suicidal plan within the past 6 months prior to admission? : No Access to Means: No What has been your use of drugs/alcohol within the last 12 months?: THC, XANAX, HEROIN  Previous Attempts/Gestures: No How many times?: 96(29 YEARS OLD) Other Self Harm Risks: NO Triggers for Past Attempts: Unknown Intentional Self Injurious Behavior: Cutting(LAST TIME 29 Y.O) Comment - Self Injurious Behavior: N/A Family Suicide History: Unknown Recent stressful life event(s): (UNKNOWN ) Persecutory voices/beliefs?: No Depression: No Substance abuse history and/or treatment for substance abuse?: Yes Suicide prevention information given to non-admitted patients: Yes  Risk to Others within the past 6 months Homicidal Ideation: No Does patient have any lifetime risk of violence toward others beyond the six months prior to admission? : Unknown Thoughts of Harm to Others: No Current Homicidal Intent: No Current Homicidal Plan: No Access to Homicidal Means: No Identified Victim: N/A History of harm to others?: No Assessment of Violence: In past 6-12 months(COMMUNICATING THREATS ) Violent Behavior Description: N/A Does patient have access to weapons?: No Criminal Charges Pending?: Yes(COMMUNICATING THREATS ) Describe Pending Criminal Charges: (COMMUNICATING THREATS ) Does patient have a court date: Yes(May, 2021) Court Date: (May) Is patient on probation?: No  Psychosis Hallucinations: None noted Delusions: None noted  Mental Status Report Appearance/Hygiene: Other  (Comment)(none accessed ) Eye Contact: Good Motor Activity: Other (Comment)(none accessed ) Speech: Logical/coherent Level of Consciousness: Alert Mood: (flat) Affect: Flat Anxiety Level: None Thought Processes: Coherent, Relevant, Circumstantial Judgement: Partial Orientation: Person, Place, Situation, Time, Appropriate for developmental age Obsessive Compulsive Thoughts/Behaviors: None  Cognitive Functioning Concentration: Fair Memory: Recent Intact Is patient IDD: No Insight: Fair Impulse Control: Fair Appetite: (unknown ) Have you had any weight changes? : (unknown ) Sleep: Unable to Assess Total Hours of Sleep: (unknown) Vegetative Symptoms: None  ADLScreening [facility] Assessment Services) Patient's cognitive ability adequate to safely complete daily activities?: Yes  Prior Inpatient Therapy Prior Inpatient Therapy: Yes Prior Therapy Dates: (none reported ) Reason for Treatment: (NONE REPORTED )  Prior Outpatient Therapy Prior Outpatient Therapy: No Does patient have an ACCT team?: No Does patient have Intensive In-House Services?  : No Does patient have [facility] services? : No Does patient have P4CC services?: No  ADL Screening (condition at time of admission) Patient's cognitive ability adequate to safely complete daily activities?: Yes Is the patient deaf or have difficulty hearing?: No Does the patient have difficulty seeing, even when wearing glasses/contacts?: No Does the patient have difficulty concentrating, remembering, or making decisions?: No Does the patient have difficulty dressing or bathing?: No Does the patient have difficulty walking or climbing stairs?: No Weakness of Legs: None Weakness of Arms/Hands: None     Therapy Consults (therapy consults require a physician order) PT Evaluation Needed: No OT Evalulation Needed: No  SLP Evaluation Needed: No Abuse/Neglect Assessment (Assessment to be complete while patient is alone) Abuse/Neglect  Assessment Can Be Completed: Unable to assess, patient is non-responsive or altered mental status Values / Beliefs Cultural Requests During Hospitalization: None Spiritual Requests During Hospitalization: None Consults Spiritual Care Consult Needed: No Transition of Care Team Consult Needed: No Advance Directives (For Healthcare) Does Patient Have a Medical Advance Directive?: No Would patient like information on creating a medical advance directive?: No - Patient declined          Disposition: Per Marciano Sequin NP, Pt will be discharges with outpatient resources for substances Disposition Initial Assessment Completed for this Encounter: Yes Disposition of Patient: Discharge(WITH OPT RESOURCES) Patient refused recommended treatment: No  On Site Evaluation by:   Reviewed with Physician:    Celedonio Miyamoto 05/14/2019 1:29 PM

## 2019-05-14 NOTE — H&P (Signed)
Behavioral Health Medical Screening Exam  Edward White is an 29 y.o. male. He is calm and cooperative. He was brought in by sheriff under IVC from his parents for opioid use disorder. Paperwork states patient has made suicidal threats. Patient admits to past history of SI but denies any SI over the past month. Paperwork also alleges that he has communicated threats to his ex-coworkers. Patient states he was fired from his job one year ago and came back to "cuss out" his co-workers at that time. He denies physical aggression and denies HI. He states that he is not ready to stop using heroin or Xanax. We discussed increased risk of overdose and death when combining benzodiazepines and opioids, and patient states understanding. TTS counselor contacted patients' parents for collateral information and confirmed no SI/HI threats in last month. He denies AVH and shows no signs of psychosis.  Total Time spent with patient: 15 minutes  Psychiatric Specialty Exam: Physical Exam  Nursing note and vitals reviewed. Constitutional: He is oriented to person, place, and time. He appears well-developed and well-nourished.  Cardiovascular: Normal rate.  Respiratory: Effort normal.  Neurological: He is alert and oriented to person, place, and time.    Review of Systems  Constitutional: Negative.   Respiratory: Negative for cough and shortness of breath.   Gastrointestinal: Negative for diarrhea, nausea and vomiting.  Neurological: Negative for tremors and headaches.  Psychiatric/Behavioral: Negative for agitation, behavioral problems, dysphoric mood, hallucinations, self-injury, sleep disturbance and suicidal ideas. The patient is not nervous/anxious and is not hyperactive.     Blood pressure 133/93. Heart rate 120. Respirations 18. Temperature 98.7 oral. 100% O2  General Appearance: Casual  Eye Contact:  Good  Speech:  Normal Rate  Volume:  Normal  Mood:  Euthymic  Affect:  Appropriate and Congruent   Thought Process:  Coherent  Orientation:  Full (Time, Place, and Person)  Thought Content:  Logical  Suicidal Thoughts:  No  Homicidal Thoughts:  No  Memory:  Immediate;   Good Recent;   Good Remote;   Good  Judgement:  Intact  Insight:  Fair  Psychomotor Activity:  Normal  Concentration: Concentration: Good and Attention Span: Fair  Recall:  [employer] of Knowledge:Fair  Language: Good  Akathisia:  No  Handed:  Right  AIMS (if indicated):     Assets:  Communication Skills Housing Resilience Social Support  Sleep:       Musculoskeletal: Strength & Muscle Tone: within normal limits Gait & Station: normal Patient leans: N/A  Recommendations:  Based on my evaluation the patient does not appear to have an emergency medical condition. Outpatient referrals for substance use and mental health.  Aldean Baker, NP 05/14/2019, 1:38 PM

## 2019-10-10 ENCOUNTER — Encounter (HOSPITAL_BASED_OUTPATIENT_CLINIC_OR_DEPARTMENT_OTHER): Payer: Self-pay | Admitting: *Deleted

## 2019-10-10 ENCOUNTER — Other Ambulatory Visit: Payer: Self-pay

## 2019-10-10 ENCOUNTER — Emergency Department (HOSPITAL_BASED_OUTPATIENT_CLINIC_OR_DEPARTMENT_OTHER)
Admission: EM | Admit: 2019-10-10 | Discharge: 2019-10-10 | Disposition: A | Discharge status: Home / Self Care | Payer: Self-pay | Attending: Emergency Medicine | Admitting: Emergency Medicine

## 2019-10-10 DIAGNOSIS — Z5321 Procedure and treatment not carried out due to patient leaving prior to being seen by health care provider: Secondary | ICD-10-CM | POA: Insufficient documentation

## 2019-10-10 DIAGNOSIS — R2241 Localized swelling, mass and lump, right lower limb: Secondary | ICD-10-CM | POA: Insufficient documentation

## 2019-10-10 NOTE — ED Triage Notes (Signed)
Pain, redness, hot to touch and swelling to his right foot x 3 days. He thinks he was bit by a spider. He is an IV drug user but denies injecting into his foot.

## 2020-04-18 ENCOUNTER — Encounter (HOSPITAL_BASED_OUTPATIENT_CLINIC_OR_DEPARTMENT_OTHER): Payer: Self-pay | Admitting: *Deleted

## 2020-04-18 ENCOUNTER — Emergency Department (HOSPITAL_BASED_OUTPATIENT_CLINIC_OR_DEPARTMENT_OTHER)
Admission: EM | Admit: 2020-04-18 | Discharge: 2020-04-18 | Disposition: A | Discharge status: Home / Self Care | Payer: Self-pay | Attending: Emergency Medicine | Admitting: Emergency Medicine

## 2020-04-18 ENCOUNTER — Other Ambulatory Visit: Payer: Self-pay

## 2020-04-18 DIAGNOSIS — F17210 Nicotine dependence, cigarettes, uncomplicated: Secondary | ICD-10-CM | POA: Insufficient documentation

## 2020-04-18 DIAGNOSIS — K0889 Other specified disorders of teeth and supporting structures: Secondary | ICD-10-CM | POA: Insufficient documentation

## 2020-04-18 MED ORDER — AMOXICILLIN 500 MG PO CAPS: 500.0000 mg | ORAL_CAPSULE | Freq: Three times a day (TID) | ORAL | 0 refills | Status: AC

## 2020-04-18 MED ORDER — CYCLOBENZAPRINE HCL 10 MG PO TABS
10.0000 mg | ORAL_TABLET | Freq: Once | ORAL | Status: AC
  Administered 2020-04-18: 10 mg via ORAL
  Filled 2020-04-18: qty 1

## 2020-04-18 MED ORDER — CYCLOBENZAPRINE HCL 10 MG PO TABS: 10.0000 mg | ORAL_TABLET | Freq: Two times a day (BID) | ORAL | 0 refills | Status: AC | PRN

## 2020-04-18 MED ORDER — CYCLOBENZAPRINE HCL 10 MG PO TABS: 10.0000 mg | ORAL_TABLET | Freq: Two times a day (BID) | ORAL | 0 refills | Status: DC | PRN

## 2020-04-18 MED ORDER — LIDOCAINE VISCOUS HCL 2 % MT SOLN: 15.0000 mL | OROMUCOSAL | 2 refills | Status: AC | PRN

## 2020-04-18 MED ORDER — LIDOCAINE VISCOUS HCL 2 % MT SOLN: 15.0000 mL | OROMUCOSAL | 2 refills | Status: DC | PRN

## 2020-04-18 MED ORDER — KETOROLAC TROMETHAMINE 60 MG/2ML IM SOLN
30.0000 mg | Freq: Once | INTRAMUSCULAR | Status: AC
  Administered 2020-04-18: 30 mg via INTRAMUSCULAR
  Filled 2020-04-18: qty 2

## 2020-04-18 MED ORDER — ONDANSETRON 4 MG PO TBDP: 4.0000 mg | ORAL_TABLET | Freq: Three times a day (TID) | ORAL | 0 refills | Status: DC | PRN

## 2020-04-18 MED ORDER — ONDANSETRON 4 MG PO TBDP: 4.0000 mg | ORAL_TABLET | Freq: Three times a day (TID) | ORAL | 0 refills | Status: AC | PRN

## 2020-04-18 MED ORDER — NAPROXEN 500 MG PO TABS: 500.0000 mg | ORAL_TABLET | Freq: Two times a day (BID) | ORAL | 0 refills | Status: AC

## 2020-04-18 MED ORDER — AMOXICILLIN 500 MG PO CAPS: 500.0000 mg | ORAL_CAPSULE | Freq: Three times a day (TID) | ORAL | 0 refills | Status: DC

## 2020-04-18 NOTE — ED Provider Notes (Signed)
[facility] EMERGENCY DEPARTMENT Provider Note   CSN: [id number] Arrival date & time: 04/18/20  1704     History Chief Complaint  Patient presents with  . Dental Pain    Edward White is a 30 y.o. male.  HPI      Edward White is a 30 y.o. male, with a history of anxiety, bipolar, IV drug use, presenting to the ED with dental pain worsening over the last few days.  Patient states he underwent wisdom tooth removal in the left upper and left lower locations of the jaw on Wednesday, February 23 performed by Dr Cathie Olden with [facility]. He notes increasing pain to the left side of his mouth and some stiffness in his jaw.  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 He also adds he is an IV heroin user. He notes he has had instances of feeling hot and some chills over the last several days.  When he has measured his temperature at home, however, he has not had a fever.  Denies vomiting, headache, syncope, neck pain, swelling, chest pain, shortness of breath, or any other complaints.   Past Medical History:  Diagnosis Date  . Anxiety   . Bipolar 1 disorder (HCC)   . Depression     Patient Active Problem List   Diagnosis Date Noted  . Substance induced mood disorder (HCC) 03/09/2019  . Opioid use disorder, severe, dependence (HCC) 03/09/2019  . Adjustment disorder with mixed anxiety and depressed mood 03/09/2019  . Opioid use disorder 01/31/2019  . Polysubstance dependence (HCC)   . MDD (major depressive disorder), recurrent episode, severe (HCC) 10/23/2018    Past Surgical History:  Procedure Laterality Date  . MANDIBLE FRACTURE SURGERY         No family history on file.  Social History   Tobacco Use  . Smoking status: Current Every Day Smoker    Packs/day: 0.50    Years: 1.00    Pack years: 0.50    Types: Cigarettes  . Smokeless tobacco: Never Used  Vaping Use  . Vaping Use: Some days  .  Substances: Nicotine  Substance Use Topics  . Alcohol use: No  . Drug use: Yes    Frequency: 5.0 times per week    Types: Marijuana, Heroin, IV    Home Medications Prior to Admission medications   Medication Sig Start Date End Date Taking? Authorizing Provider  naproxen (NAPROSYN) 500 MG tablet Take 1 tablet (500 mg total) by mouth 2 (two) times daily. 04/18/20  Yes Joy, Shawn C, PA-C  amoxicillin (AMOXIL) 500 MG capsule Take 1 capsule (500 mg total) by mouth 3 (three) times daily. 04/18/20   Joy, Shawn C, PA-C  busPIRone (BUSPAR) 10 MG tablet Take 1 tablet (10 mg total) by mouth 2 (two) times daily. 03/09/19   Oneta Rack, NP  cyclobenzaprine (FLEXERIL) 10 MG tablet Take 1 tablet (10 mg total) by mouth 2 (two) times daily as needed for muscle spasms. 04/18/20   Joy, Shawn C, PA-C  hydrOXYzine (ATARAX/VISTARIL) 25 MG tablet Take 1 tablet (25 mg total) by mouth every 6 (six) hours as needed for anxiety. 03/09/19   Oneta Rack, NP  lidocaine (XYLOCAINE) 2 % solution Use as directed 15 mLs in the mouth or throat as needed for mouth pain. 04/18/20   Joy, Shawn C, PA-C  ondansetron (ZOFRAN ODT) 4 MG disintegrating tablet Take 1 tablet (4 mg  total) by mouth every 8 (eight) hours as needed for nausea or vomiting. 04/18/20   Joy, Shawn C, PA-C    Allergies    Haloperidol  Review of Systems   Review of Systems  Constitutional: Positive for chills.  HENT: Positive for dental problem. Negative for drooling, sore throat, trouble swallowing and voice change.   Respiratory: Negative for shortness of breath.   Cardiovascular: Negative for chest pain and palpitations.  Gastrointestinal: Negative for nausea and vomiting.  Musculoskeletal: Negative for neck pain and neck stiffness.  Neurological: Negative for dizziness, syncope, weakness and headaches.  All other systems reviewed and are negative.   Physical Exam Updated Vital Signs BP (!) 123/58 (BP Location: Right Arm)   Pulse 67   Temp 98.4  F (36.9 C) (Oral)   Resp 18   Ht 5\' 10"  (1.778 m)   Wt 81.6 kg   SpO2 100%   BMI 25.83 kg/m   Physical Exam Vitals and nursing note reviewed.  Constitutional:      General: He is not in acute distress.    Appearance: He is well-developed. He is not ill-appearing or diaphoretic.  HENT:     Head: Normocephalic and atraumatic.     Mouth/Throat:     Mouth: Mucous membranes are moist.     Pharynx: Oropharynx is clear.     Comments: Patient has some decreased mouth opening that he states is due to pain.  He tolerates oral secretions without noted difficulty. No noted swelling, fluctuance, or fullness to the face, mandible, submandibular/submental regions.  No swelling, tenderness, or other abnormality noted to the soft tissues of the neck. No noted sublingual swelling or fullness. Eyes:     Conjunctiva/sclera: Conjunctivae normal.  Cardiovascular:     Rate and Rhythm: Normal rate and regular rhythm.     Pulses: Normal pulses.     Heart sounds: Normal heart sounds.  Pulmonary:     Effort: Pulmonary effort is normal. No respiratory distress.  Abdominal:     Tenderness: There is no guarding.  Musculoskeletal:     Cervical back: Normal range of motion and neck supple. No tenderness.  Lymphadenopathy:     Cervical: No cervical adenopathy.  Skin:    General: Skin is warm and dry.  Neurological:     Mental Status: He is alert.  Psychiatric:        Mood and Affect: Mood and affect normal.        Speech: Speech normal.        Behavior: Behavior normal.     ED Results / Procedures / Treatments   Labs (all labs ordered are listed, but only abnormal results are displayed) Labs Reviewed  CULTURE, BLOOD (ROUTINE X 2)  CULTURE, BLOOD (ROUTINE X 2)  CULTURE, BLOOD (SINGLE)    EKG None  Radiology No results found.  Procedures Procedures   Medications Ordered in ED Medications  cyclobenzaprine (FLEXERIL) tablet 10 mg (10 mg Oral Given 04/18/20 1927)  ketorolac (TORADOL)  injection 30 mg (30 mg Intramuscular Given 04/18/20 1927)    ED Course  I have reviewed the triage vital signs and the nursing notes.  Pertinent labs & imaging results that were available during my care of the patient were reviewed by me and considered in my medical decision making (see chart for details).  Clinical Course as of 04/18/20 2015  Sat Apr 18, 2020  1853 Spoke with Dr. Apr 20, 2020, dentist from [facility] who performed patient's wisdom tooth removal procedure  on Wednesday and saw the patient in the office yesterday. We talked about the patient's symptoms, presentation, and physical exam findings. She states when she saw him yesterday he also had pain and difficulty with opening the jaw.  Because he had been smoking since the procedure, she suspected he may have a dry socket.  She cleared the clot out of the socket on the lower left and replaced it with dry socket paste. She adds that they had to use multiple dental blocks on the day of the original procedure because the usual amount of anesthetic was not effective.  She states these dental blocks can cause some stiffness in the jaw muscles. She recommends adding a muscle relaxer to the patient's regimen as well as amoxicillin. We discussed the need for imaging studies, but she states based on the lack of swelling and the presence of normal vital signs, this is likely not necessary. [SJ]    Clinical Course User Index [SJ] Joy, Shawn C, PA-C   MDM Rules/Calculators/A&P                          Patient presents with dental pain following wisdom tooth removal a few days ago. Patient is nontoxic appearing, afebrile, not tachycardic, not tachypneic, not hypotensive, excellent SPO2 on room air, and is in no apparent distress.   Since the patient mentioned feeling hot while at home with intermittent chills, we have to at least consider endocarditis, especially in light of the patient's IV drug use and recent dental  procedure, though my suspicion is low at this time.  Blood cultures were collected. Antibiotic therapy initiated.  Encourage close follow-up with dentistry.  Findings and plan of care discussed with attending physician, Melene Plan, DO.   Final Clinical Impression(s) / ED Diagnoses Final diagnoses:  Pain, dental    Rx / DC Orders ED Discharge Orders         Ordered    cyclobenzaprine (FLEXERIL) 10 MG tablet  2 times daily PRN,   Status:  Discontinued        04/18/20 1919    amoxicillin (AMOXIL) 500 MG capsule  3 times daily,   Status:  Discontinued        04/18/20 1919    ondansetron (ZOFRAN ODT) 4 MG disintegrating tablet  Every 8 hours PRN,   Status:  Discontinued        04/18/20 1919    lidocaine (XYLOCAINE) 2 % solution  As needed,   Status:  Discontinued        04/18/20 1919    naproxen (NAPROSYN) 500 MG tablet  2 times daily        04/18/20 1940    lidocaine (XYLOCAINE) 2 % solution  As needed        04/18/20 1943    cyclobenzaprine (FLEXERIL) 10 MG tablet  2 times daily PRN        04/18/20 1943    amoxicillin (AMOXIL) 500 MG capsule  3 times daily        04/18/20 1943    ondansetron (ZOFRAN ODT) 4 MG disintegrating tablet  Every 8 hours PRN        04/18/20 1943           Anselm Pancoast, PA-C 04/18/20 2015    Melene Plan, DO 04/18/20 2209

## 2020-04-18 NOTE — Discharge Instructions (Signed)
  Antiinflammatory medications: Take 600 mg of ibuprofen every 6 hours or 440 mg (over the counter dose) to 500 mg (prescription dose) of naproxen every 12 hours for the next 3 days. After this time, these medications may be used as needed for pain. Take these medications with food to avoid upset stomach. Choose only one of these medications, do not take them together. Acetaminophen (generic for Tylenol): Should you continue to have additional pain while taking the ibuprofen or naproxen, you may add in acetaminophen as needed. Your daily total maximum amount of acetaminophen from all sources should be limited to 4000mg /day for persons without liver problems, or 2000mg /day for those with liver problems.  Please take all of your antibiotics until finished!   You may develop abdominal discomfort or diarrhea from the antibiotic.  You may help offset this with probiotics which you can buy or get in yogurt. Do not eat or take the probiotics until 2 hours after your antibiotic.   Cyclobenzaprine: Cyclobenzaprine (generic for Flexeril) is a muscle relaxer and can help relieve stiff muscles or muscle spasms.  Do not drive or perform other dangerous activities while taking this medication as it can cause drowsiness as well as changes in reaction time and judgement.  Nausea/vomiting: Use the ondansetron (generic for Zofran) for nausea or vomiting.  This medication may not prevent all vomiting or nausea, but can help facilitate better hydration. Things that can help with nausea/vomiting also include peppermint/menthol candies, vitamin B12, and ginger.  Follow-up: Follow-up with the dentist for any further management of this issue.

## 2020-04-18 NOTE — ED Triage Notes (Signed)
Pt reports left lower dental pain since Wednesday. He saw his dentist on Friday and they packed it with "clove". He has also used ambesol with minimal relief

## 2020-04-18 NOTE — ED Notes (Signed)
ED Provider at bedside. 

## 2020-04-18 NOTE — ED Notes (Signed)
EDP at bedside  

## 2020-04-24 LAB — CULTURE, BLOOD (SINGLE): Culture: NO GROWTH

## 2020-04-24 LAB — CULTURE, BLOOD (ROUTINE X 2)
Culture: NO GROWTH
Culture: NO GROWTH

## 2021-10-14 IMAGING — DX DG CHEST 1V PORT
1 series · 1 of 1 positions shown · non-contrast
Comparison: 10/26/2018

CLINICAL DATA: COVID positive 10 days ago

EXAM:
PORTABLE CHEST 1 VIEW

[chest ap]
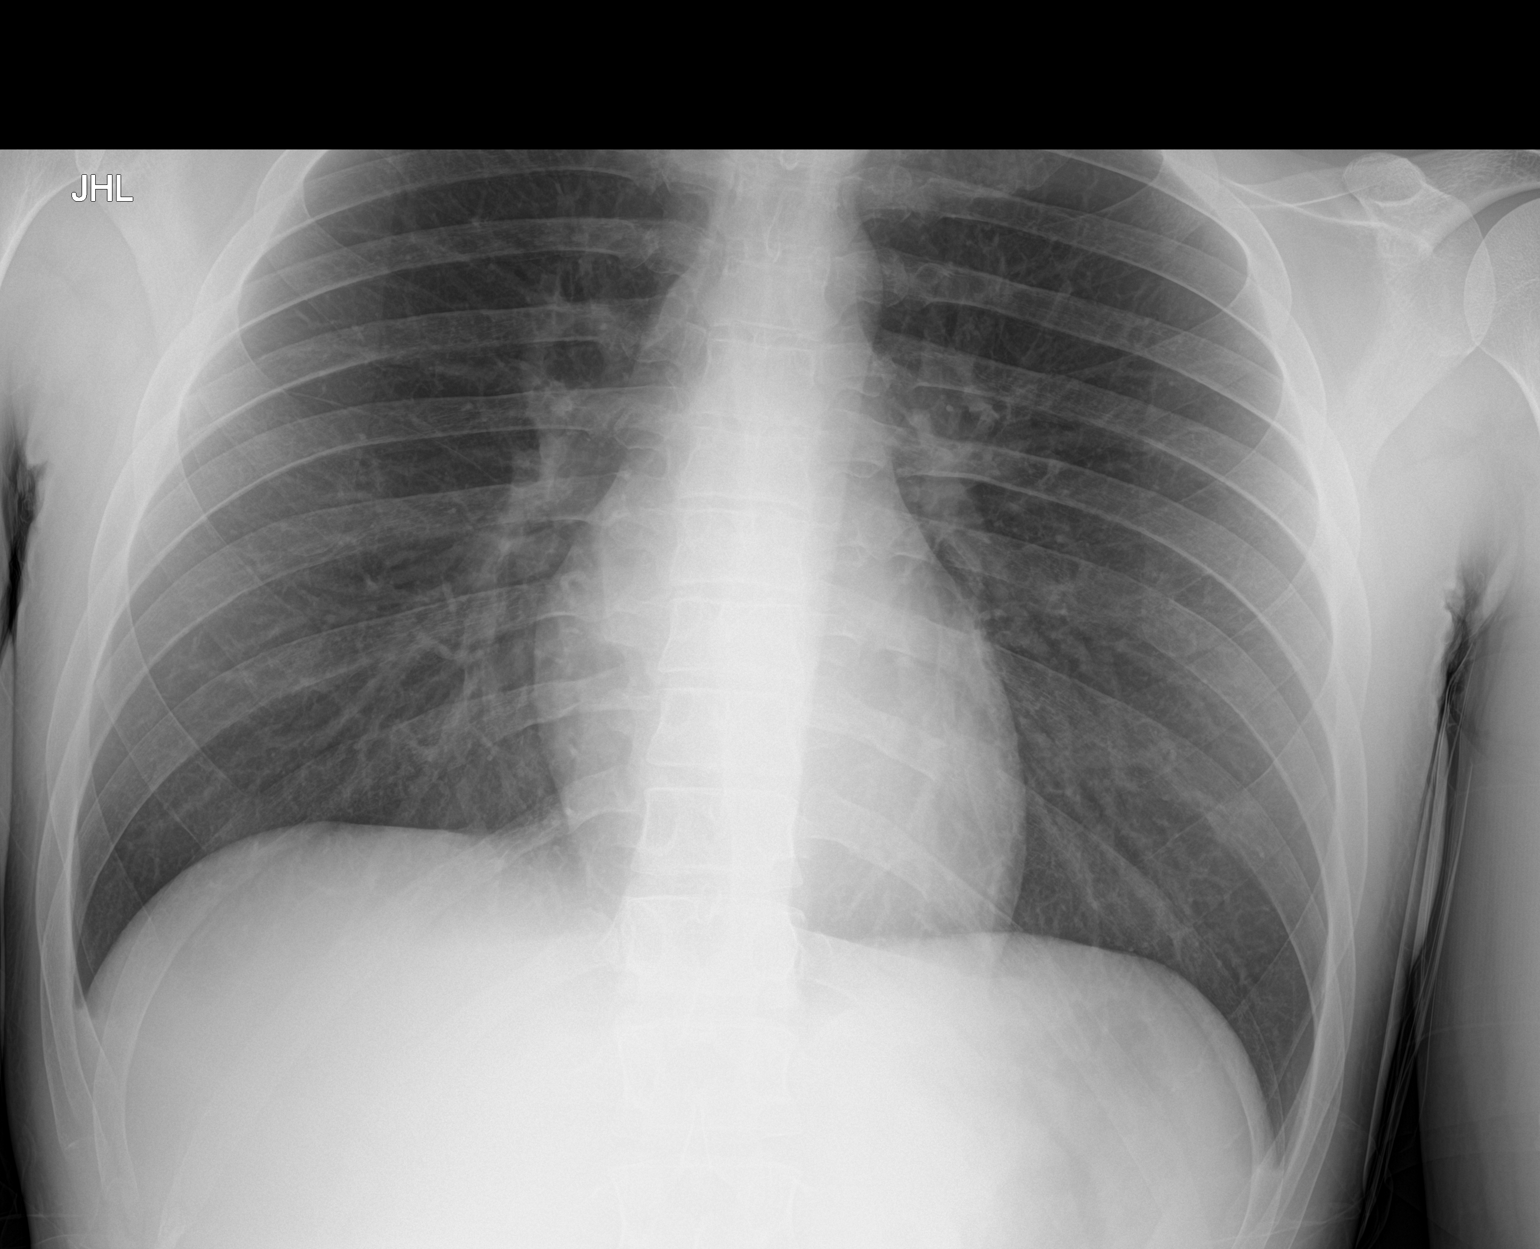

[1 of 1 positions shown; findings below may reference images not displayed]

FINDINGS: Cardiomediastinal contours are normal.

Lungs are clear.

No sign of pleural effusion.

No acute bone finding.
IMPRESSION: No acute cardiopulmonary disease.

## 2022-04-13 ENCOUNTER — Ambulatory Visit (HOSPITAL_COMMUNITY)
Admission: EM | Admit: 2022-04-13 | Discharge: 2022-04-13 | Disposition: A | Discharge status: Home / Self Care | Payer: BLUE CROSS/BLUE SHIELD

## 2022-04-13 DIAGNOSIS — F1910 Other psychoactive substance abuse, uncomplicated: Secondary | ICD-10-CM

## 2022-04-13 DIAGNOSIS — Z79899 Other long term (current) drug therapy: Secondary | ICD-10-CM

## 2022-04-13 NOTE — Discharge Instructions (Addendum)

## 2022-04-13 NOTE — ED Triage Notes (Signed)
Pt presents to [facility] voluntarily, accompanied by his girlfriend with complaint of depression and anxiety. Pt is requesting therapy and medication at this time. Pt has history of substance abuse and appears lethargic, but arouses to verbal stimuli during triage process. Pt reports using Xanax and heroine earlier today and is currently working with [facility] program to address substance abuse needs. Pt denies SI, HI, AVH.

## 2022-04-13 NOTE — ED Provider Notes (Signed)
Behavioral Health Urgent Care Medical Screening Exam  Patient Name: Edward White MRN: [id number] Date of Evaluation: 04/13/22 Chief Complaint:  "I need to get my meds right". Diagnosis:  Final diagnoses:  Encounter for medication management  Polysubstance abuse ([place])    History of Present illness: Edward White is a 32 y.o. male.  With psychiatric history of polysubstance abuse, opioid use disorder, depression, substance-induced mood disorder, and adjustment disorder with mixed anxiety and depressed mood who presented voluntarily as a walk-in to [facility] accompanied by his girlfriend, requesting assistance with medication management for depression and anxiety.  Patient was seen face-to-face by this provider and chart reviewed.Patient has been seen at [facility] for substance treatment in the past and also inpatient at [facility] in 2021. On evaluation, patient is alert, but appears under the influence of a substance. Partially oriented and cooperative. Speech is slurred but clear. Pt appears casual. Eye contact is poor. Mood is euthymic, affect is congruent with mood. Thought process is goal directed and thought content is WDL. Pt denies SI/HI/AVH or paranoia. There is no objective indication that the patient is responding to internal stimuli. No delusions elicited during this assessment.    Patient reports "I've just been having really bad panic and anxiety attacks 24/7, and I was prescribed Celexa and BuSpar for depression and anxiety and I just need my medications to be looked at, and I see a provider a [facility] for Suboxone, he does meds but he suggested you guys are the best place for me to come get my medications right and I also need counseling because I don't know if I'm on the right ones, and I also take Xanax pills but they belong to my girlfriend".  Patient reports he lives with his girlfriend, mother and mother's husband. Patient denies presence of a gun or weapon at home, and reports he feels safe  returning home tonight and will follow up with the [facility] outpatient walk in clinic tomorrow per provider recommendation for medication management.  Pt reported, he last cut himself in an attempted suicide at age 66 " I've had so many suicide attempts in the past but nothing worked, so I just stopped and I feel like there is a purpose for me being here".   Patient reports " I also use heroin regularly and about I use about 2 mg of xanax/daily and I've been smoking weed since I was 32 years old.  I took Xanax before I came because I had a panic attack and I also snorted a little bump of heroin earlier and I was an IV user and now I only snort.  Patient reports "my [facility] clinic helps people with Suboxone to get clean and I'm a substance user and now I am really close to getting clean but I need help with the depression and anxiety, I don't need help with substance abuse because the suboxone clinic takes care of that".  Support, encouragement, and reassurance provided about ongoing stressors.  The patient is provided with opportunity for questions. Discussed consequences of substance abuse use, up to and including fatality/OD and availability of treatment options here at the [facility] and outpatient as well. Patient verbalized his acknowledgement/understanding and reports he is already receiving suboxone twice daily through his outpatient substance abuse provider at [facility] and the services are effective as he is close to getting clean.    [place] ED from 04/13/2022 in [facility] ED from 04/18/2020 in [facility] Emergency Department at  [facility] Admission (Discharged) from OP Visit from 03/09/2019 in [place] No Risk Low Risk No Risk       Psychiatric Specialty Exam  Presentation  General Appearance:Casual  Eye Contact:Poor  Speech:Slurred  Speech Volume:Normal  Handedness:Right   Mood and  Affect  Mood: Euthymic  Affect: Congruent   Thought Process  Thought Processes: Goal Directed  Descriptions of Associations:Intact  Orientation:Partial  Thought Content:WDL    Hallucinations:None  Ideas of Reference:None  Suicidal Thoughts:No  Homicidal Thoughts:No   Sensorium  Memory: Immediate Fair  Judgment: Poor  Insight: Poor   Executive Functions  Concentration: Fair  Attention Span: Fair  Recall: [place] of Knowledge: Fair  Language: Fair   Psychomotor Activity  Psychomotor Activity: Normal   Assets  Assets: Armed forces logistics/support/administrative officer; Desire for Improvement; Social Support   Sleep  Sleep: Fair  Number of hours: No data recorded  Physical Exam: Physical Exam Constitutional:      General: He is not in acute distress.    Appearance: He is not diaphoretic.  HENT:     Head: Normocephalic.     Right Ear: External ear normal.     Left Ear: External ear normal.     Nose: No congestion.  Eyes:     General:        Right eye: No discharge.        Left eye: No discharge.  Pulmonary:     Effort: No respiratory distress.  Chest:     Chest wall: No tenderness.  Neurological:     Mental Status: He is alert. Mental status is at baseline.  Psychiatric:        Attention and Perception: Attention and perception normal.        Mood and Affect: Mood and affect normal.        Speech: Speech is slurred.        Behavior: Behavior is cooperative.        Thought Content: Thought content normal. Thought content is not paranoid or delusional. Thought content does not include homicidal or suicidal ideation. Thought content does not include homicidal or suicidal plan.        Cognition and Memory: Memory normal.    Review of Systems  Constitutional:  Negative for chills, diaphoresis and fever.  HENT:  Negative for congestion.   Eyes:  Negative for discharge.  Respiratory:  Negative for cough, shortness of breath and wheezing.    Cardiovascular:  Negative for chest pain and palpitations.  Gastrointestinal:  Negative for diarrhea, nausea and vomiting.  Neurological:  Negative for tingling, seizures, loss of consciousness, weakness and headaches.  Psychiatric/Behavioral:  Positive for substance abuse. Negative for depression, hallucinations, memory loss and suicidal ideas. The patient is not nervous/anxious and does not have insomnia.      Musculoskeletal: Strength & Muscle Tone: within normal limits Gait & Station: normal Patient leans: N/A   [place] [facility] Discharge Disposition for Follow up and Recommendations: Based on my evaluation the patient does not appear to have an emergency medical condition and can be discharged with resources and follow up care in outpatient services for Medication Management, Substance Abuse Intensive Outpatient Program, and Individual Therapy. Patient reports he is already in an outpatient substance abuse treatment program at [facility] and takes suboxone twice daily.  Recommend discharge and follow-up with outpatient psychiatric services. Patient provided with [facility] outpatient walk in clinic information/resources and encouraged to follow up.  Patient does  not meet inpatient psychiatric admission criteria or IVC criteria at this time.  There is no evidence of imminent risk of harm to self or others.  Discussed methods to reduce the risk of self-injury or suicide attempts: Frequent conversations regarding unsafe thoughts. Remove all significant sharps. Remove all firearms. Remove all medications, including over-the-counter meds. Consider lockbox for medications and having a responsible person dispense medications until patient has strengthened coping skills. Room checks for sharps or other harmful objects. Secure all chemical substances that can be ingested or inhaled.   Please refrain from using alcohol or illicit substances, as they can affect your mood and can cause depression, anxiety or other  concerning symptoms.  Alcohol can increase the chance that a person will make reckless decisions, like attempting suicide, and can increase the lethality of a drug overdose.    Discussed crisis plan, calling 911, or going to the ED if condition changes or worsens.  Patient verbalizes understanding.  Patient discharged home and condition at discharge is stable.  Randon Goldsmith, NP 04/13/2022, 11:43 PM
